# Patient Record
Sex: Female | Born: 1985
Health system: Southern US, Community
[De-identification: ages and names within clinical notes are randomized; demographics above are authoritative.]

## PROBLEM LIST (undated history)

## (undated) DIAGNOSIS — R002 Palpitations: Secondary | ICD-10-CM

## (undated) DIAGNOSIS — G35D Multiple sclerosis, unspecified: Secondary | ICD-10-CM

## (undated) DIAGNOSIS — M419 Scoliosis, unspecified: Secondary | ICD-10-CM

## (undated) DIAGNOSIS — S42302A Unspecified fracture of shaft of humerus, left arm, initial encounter for closed fracture: Secondary | ICD-10-CM

## (undated) DIAGNOSIS — G8929 Other chronic pain: Secondary | ICD-10-CM

## (undated) DIAGNOSIS — M25559 Pain in unspecified hip: Secondary | ICD-10-CM

## (undated) DIAGNOSIS — G35 Multiple sclerosis: Secondary | ICD-10-CM

## (undated) HISTORY — PX: CHOLECYSTECTOMY: SHX55

## (undated) HISTORY — PX: OTHER SURGICAL HISTORY: SHX169

## (undated) HISTORY — PX: NASAL SEPTUM SURGERY: SHX37

## (undated) HISTORY — DX: Multiple sclerosis, unspecified: G35.D

## (undated) HISTORY — DX: Multiple sclerosis: G35

## (undated) HISTORY — DX: Unspecified fracture of shaft of humerus, left arm, initial encounter for closed fracture: S42.302A

## (undated) HISTORY — DX: Palpitations: R00.2

## (undated) HISTORY — PX: TONSILLECTOMY: SUR1361

---

## 2009-09-13 ENCOUNTER — Emergency Department (HOSPITAL_COMMUNITY): Admission: EM | Admit: 2009-09-13 | Discharge: 2009-09-13 | Payer: Self-pay | Admitting: Family Medicine

## 2009-10-22 ENCOUNTER — Ambulatory Visit: Payer: Self-pay | Admitting: Physician Assistant

## 2009-10-22 DIAGNOSIS — K8020 Calculus of gallbladder without cholecystitis without obstruction: Secondary | ICD-10-CM | POA: Insufficient documentation

## 2009-10-22 DIAGNOSIS — G5620 Lesion of ulnar nerve, unspecified upper limb: Secondary | ICD-10-CM | POA: Insufficient documentation

## 2009-10-29 ENCOUNTER — Ambulatory Visit: Payer: Self-pay | Admitting: Physician Assistant

## 2009-10-30 ENCOUNTER — Encounter: Payer: Self-pay | Admitting: Physician Assistant

## 2009-10-30 LAB — CONVERTED CEMR LAB
ALT: 22 units/L (ref 0–35)
AST: 18 units/L (ref 0–37)
Albumin: 3.8 g/dL (ref 3.5–5.2)
Alkaline Phosphatase: 52 units/L (ref 39–117)
BUN: 13 mg/dL (ref 6–23)
Basophils Absolute: 0 10*3/uL (ref 0.0–0.1)
CO2: 21 meq/L (ref 19–32)
Calcium: 8.4 mg/dL (ref 8.4–10.5)
Chloride: 107 meq/L (ref 96–112)
Creatinine, Ser: 0.63 mg/dL (ref 0.40–1.20)
Eosinophils Relative: 2 % (ref 0–5)
Glucose, Bld: 81 mg/dL (ref 70–99)
LDL Cholesterol: 103 mg/dL — ABNORMAL HIGH (ref 0–99)
Lymphocytes Relative: 32 % (ref 12–46)
MCHC: 32.5 g/dL (ref 30.0–36.0)
MCV: 92.3 fL (ref 78.0–100.0)
Monocytes Absolute: 0.4 10*3/uL (ref 0.1–1.0)
Monocytes Relative: 5 % (ref 3–12)
Neutro Abs: 5.5 10*3/uL (ref 1.7–7.7)
RBC: 4.56 M/uL (ref 3.87–5.11)
RDW: 13.9 % (ref 11.5–15.5)
Sodium: 140 meq/L (ref 135–145)
Total Bilirubin: 0.5 mg/dL (ref 0.3–1.2)
VLDL: 26 mg/dL (ref 0–40)
WBC: 9 10*3/uL (ref 4.0–10.5)

## 2010-02-02 ENCOUNTER — Emergency Department (HOSPITAL_COMMUNITY)
Admission: EM | Admit: 2010-02-02 | Discharge: 2010-02-02 | Payer: Self-pay | Source: Home / Self Care | Admitting: Emergency Medicine

## 2010-04-01 NOTE — Letter (Signed)
Summary: PT INFORMATION SHEET  PT INFORMATION SHEET   Imported By: Arta Bruce 10/23/2009 11:49:39  _____________________________________________________________________  External Attachment:    Type:   Image     Comment:   External Document

## 2010-04-01 NOTE — Assessment & Plan Note (Signed)
Summary: NEW ESTAB/ LEFT ARM///KT   Vital Signs:  Patient profile:   25 year old female Height:      68 inches Weight:      357 pounds BMI:     54.48 Temp:     97.8 degrees F oral Pulse rate:   64 / minute Pulse rhythm:   regular Resp:     18 per minute BP sitting:   98 / 68  (left arm)  Vitals Entered By: CMA Student CC: new patient visit, left side for left arm, elbow to fingers experiencing knumbness, 2 weeks onset Is Patient Diabetic? No Pain Assessment Patient in pain? no       Does patient need assistance? Functional Status Self care Ambulation Normal   Primary Care Provider:  Tereso Newcomer, PA-C  CC:  new patient visit, left side for left arm, elbow to fingers experiencing knumbness, and 2 weeks onset.  History of Present Illness: New patient.  Moved here from [facility] in Jan. Health maint: No pap in more than a year.  Never abnormal. Has IUD.  Placed in 2009.  L arm pain:  x 2 weeks.  No injury.  Sits at desk all day.  Uses telephone.  Rests elbow on desk.  Notes worsening pain with leaning on desk and doing her job.  Notes tingling and numbness in 4th and 5th fingers.  No pain.  Comes and goes.  Has not tried anything for it.   Habits & Providers  Alcohol-Tobacco-Diet     Tobacco Status: current  Exercise-Depression-Behavior     Drug Use: no  Current Medications (verified): 1)  None  Allergies (verified): No Known Drug Allergies  Past History:  Past Medical History: Cholelithiasis   a.  apparently had cholestatic jaundice and emergent cholecystectomy in [place] in 12/2008   b.  states she has retained stone s/p IUD 2009  Past Surgical History: Cholecystectomy 12/2008 Tonsillectomy deviated septum surgery  Family History: PGF - DM MGM - HTN MGF - lymphoma  Social History: Current Smoker   a.  3/4 ppd since age 50 Alcohol use-yes (rare) Drug use-no Single . . has boyfriend 1 kid Occupation: Education officer, community Smoking Status:   current Drug Use:  no Occupation:  employed  Review of Systems      See HPI General:  Denies chills, fatigue, and fever. CV:  Denies shortness of breath with exertion. GI:  Denies abdominal pain, bloody stools, and vomiting. GU:  Denies hematuria. Endo:  Complains of excessive thirst; denies weight change. Heme:  Denies bleeding.  Physical Exam  General:  alert, well-developed, and well-nourished.   Head:  normocephalic and atraumatic.   Neck:  supple.   Lungs:  normal breath sounds.   Heart:  normal rate and regular rhythm.   Msk:  no pain with percussion of ulnar tunnel on left grip strength mildly decreased on left no deformity of left hand Neurologic:  biceps and brachiradialis DTRs 2 + bilat  Psych:  normally interactive.     Impression & Recommendations:  Problem # 1:  ULNAR NEUROPATHY (ICD-354.2) nsaids elbow brace check thyroid, sugar and cbc  Problem # 2:  PREVENTIVE HEALTH CARE (ICD-V70.0) schedule CPP wants chol checked . . mother has high trigs  Complete Medication List: 1)  Naprosyn 500 Mg Tabs (Naproxen) .... Take 1 tablet by mouth two times a day with food as needed  Patient Instructions: 1)  Take naprosyn 500 mg by mouth two times a day with food for 5-7  days.  Then, take two times a day as needed. 2)  Get an elbow brace at the pharmacy or at a medical supply store (there is one on [address] near the hospital).  It should have some padding to protect your elbow especially when you lean on your elbow. 3)  Wear it at work unit symptoms resolve, then as needed.  Wear at bedtime for the first 1-2 weeks, then as needed. 4)  Make earlier follow up if symptoms do not resolve. 5)  Return fasting for labs: 6)  CMET, CBC, TSH, Lipids (Dx 354.2; 278.01; V70.0) 7)  Please schedule a follow-up appointment in 3 months with Scott for CPP.  Prescriptions: NAPROSYN 500 MG TABS (NAPROXEN) Take 1 tablet by mouth two times a day with food as needed  #60 x 1   Entered  and Authorized by:   Tereso Newcomer PA-C   Signed by:   Tereso Newcomer PA-C on 10/22/2009   Method used:   Print then Give to Patient   RxID:   [id number]

## 2010-04-01 NOTE — Letter (Signed)
Summary: Lipid Letter  [facility]  [address] [place], [place] [address]   Phone: [phone]  Fax: [phone]    10/30/2009  [facility] [address] [place], [place]  [address]  Dear Lindie Spruce:  We have carefully reviewed your last lipid profile from 10/29/2009 and the results are noted below with a summary of recommendations for lipid management.    Cholesterol:       166     Goal: <200   HDL "good" Cholesterol:   37     Goal: >40   LDL "bad" Cholesterol:   103     Goal: <130   Triglycerides:       129     Goal: <150    Your blood counts, liver and kidney function and sugar were all normal.    TLC Diet (Therapeutic Lifestyle Change): Saturated Fats & Transfatty acids should be kept < 7% of total calories ***Reduce Saturated Fats Polyunstaurated Fat can be up to 10% of total calories Monounsaturated Fat Fat can be up to 20% of total calories Total Fat should be no greater than 25-35% of total calories Carbohydrates should be 50-60% of total calories Protein should be approximately 15% of total calories Fiber should be at least 20-30 grams a day ***Increased fiber may help lower LDL Total Cholesterol should be < 200mg /day Consider adding plant stanol/sterols to diet (example: Benacol spread) ***A higher intake of unsaturated fat may reduce Triglycerides and Increase HDL    Adjunctive Measures (may lower LIPIDS and reduce risk of Heart Attack) include: Aerobic Exercise (20-30 minutes 3-4 times a week) Limit Alcohol Consumption Weight Reduction Dietary Fiber 20-30 grams a day by mouth    Current Medications: 1)    Naprosyn 500 Mg Tabs (Naproxen) .... Take 1 tablet by mouth two times a day with food as needed  If you have any questions, please call.   Sincerely,    Tereso Newcomer, PA-C

## 2010-05-13 LAB — URINALYSIS, ROUTINE W REFLEX MICROSCOPIC
Bilirubin Urine: NEGATIVE
Protein, ur: 30 mg/dL — AB
Specific Gravity, Urine: 1.041 — ABNORMAL HIGH (ref 1.005–1.030)

## 2010-05-13 LAB — POCT I-STAT, CHEM 8
Calcium, Ion: 1.08 mmol/L — ABNORMAL LOW (ref 1.12–1.32)
Chloride: 106 mEq/L (ref 96–112)
Creatinine, Ser: 0.9 mg/dL (ref 0.4–1.2)
HCT: 49 % — ABNORMAL HIGH (ref 36.0–46.0)
Hemoglobin: 16.7 g/dL — ABNORMAL HIGH (ref 12.0–15.0)
Potassium: 3.6 mEq/L (ref 3.5–5.1)
Sodium: 140 mEq/L (ref 135–145)
TCO2: 27 mmol/L (ref 0–100)

## 2010-05-13 LAB — URINE CULTURE: Culture  Setup Time: 201112042345

## 2010-05-13 LAB — CBC
HCT: 45.8 % (ref 36.0–46.0)
Hemoglobin: 15.8 g/dL — ABNORMAL HIGH (ref 12.0–15.0)
MCH: 30.7 pg (ref 26.0–34.0)
MCHC: 34.5 g/dL (ref 30.0–36.0)
Platelets: 210 10*3/uL (ref 150–400)
WBC: 10.6 10*3/uL — ABNORMAL HIGH (ref 4.0–10.5)

## 2010-05-13 LAB — DIFFERENTIAL
Basophils Absolute: 0 10*3/uL (ref 0.0–0.1)
Eosinophils Relative: 2 % (ref 0–5)
Lymphocytes Relative: 10 % — ABNORMAL LOW (ref 12–46)
Monocytes Absolute: 0.5 10*3/uL (ref 0.1–1.0)
Monocytes Relative: 4 % (ref 3–12)
Neutrophils Relative %: 84 % — ABNORMAL HIGH (ref 43–77)

## 2010-08-31 ENCOUNTER — Emergency Department (HOSPITAL_COMMUNITY)
Admission: EM | Admit: 2010-08-31 | Discharge: 2010-08-31 | Disposition: A | Discharge status: Home / Self Care | Payer: Self-pay | Attending: Emergency Medicine | Admitting: Emergency Medicine

## 2010-08-31 DIAGNOSIS — N390 Urinary tract infection, site not specified: Secondary | ICD-10-CM | POA: Insufficient documentation

## 2010-08-31 LAB — URINALYSIS, ROUTINE W REFLEX MICROSCOPIC
Glucose, UA: NEGATIVE mg/dL
Ketones, ur: NEGATIVE mg/dL
Protein, ur: 100 mg/dL — AB
Urobilinogen, UA: 0.2 mg/dL (ref 0.0–1.0)

## 2010-08-31 LAB — URINE MICROSCOPIC-ADD ON

## 2010-09-03 LAB — URINE CULTURE

## 2010-09-30 ENCOUNTER — Emergency Department (HOSPITAL_COMMUNITY)
Admission: EM | Admit: 2010-09-30 | Discharge: 2010-09-30 | Disposition: A | Discharge status: Home / Self Care | Payer: Self-pay | Attending: Emergency Medicine | Admitting: Emergency Medicine

## 2010-09-30 ENCOUNTER — Emergency Department (HOSPITAL_COMMUNITY): Payer: Self-pay

## 2010-09-30 ENCOUNTER — Encounter: Payer: Self-pay | Admitting: Emergency Medicine

## 2010-09-30 DIAGNOSIS — S61219A Laceration without foreign body of unspecified finger without damage to nail, initial encounter: Secondary | ICD-10-CM

## 2010-09-30 DIAGNOSIS — S61209A Unspecified open wound of unspecified finger without damage to nail, initial encounter: Secondary | ICD-10-CM | POA: Insufficient documentation

## 2010-09-30 DIAGNOSIS — W268XXA Contact with other sharp object(s), not elsewhere classified, initial encounter: Secondary | ICD-10-CM | POA: Insufficient documentation

## 2010-09-30 MED ORDER — TETANUS-DIPHTH-ACELL PERTUSSIS 5-2.5-18.5 LF-MCG/0.5 IM SUSP
0.5000 mL | Freq: Once | INTRAMUSCULAR | Status: AC
  Administered 2010-09-30: 0.5 mL via INTRAMUSCULAR

## 2010-09-30 MED ORDER — HYDROCODONE-ACETAMINOPHEN 5-325 MG PO TABS: 1.0000 | ORAL_TABLET | ORAL | Status: AC | PRN

## 2010-09-30 MED ORDER — LIDOCAINE HCL 2 % IJ SOLN
10.0000 mL | Freq: Once | INTRAMUSCULAR | Status: AC
  Administered 2010-09-30: 200 mg

## 2010-09-30 MED ORDER — LIDOCAINE HCL 2 % IJ SOLN
10.0000 mL | Freq: Once | INTRAMUSCULAR | Status: AC
  Administered 2010-09-30: 200 mg via INTRADERMAL

## 2010-09-30 NOTE — ED Notes (Signed)
Suture tray and lidocaine at bedside 
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
## 2010-09-30 NOTE — ED Notes (Signed)
Tube gauze dressings applied to 2nd and 3rd digits left hand after lacerations sutured.
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
## 2010-10-10 ENCOUNTER — Emergency Department (HOSPITAL_COMMUNITY)
Admission: EM | Admit: 2010-10-10 | Discharge: 2010-10-10 | Disposition: A | Discharge status: Home / Self Care | Payer: Self-pay | Attending: Emergency Medicine | Admitting: Emergency Medicine

## 2010-10-10 ENCOUNTER — Encounter (HOSPITAL_COMMUNITY): Payer: Self-pay | Admitting: *Deleted

## 2010-10-10 DIAGNOSIS — Z4802 Encounter for removal of sutures: Secondary | ICD-10-CM | POA: Insufficient documentation

## 2010-10-10 DIAGNOSIS — F17200 Nicotine dependence, unspecified, uncomplicated: Secondary | ICD-10-CM | POA: Insufficient documentation

## 2010-10-10 MED ORDER — BACITRACIN ZINC 500 UNIT/GM EX OINT
TOPICAL_OINTMENT | CUTANEOUS | Status: AC
  Administered 2010-10-10: 13:00:00
  Filled 2010-10-10: qty 0.9

## 2010-10-10 NOTE — ED Notes (Signed)
Suture removal from left index and middle fingers.  States wants to have checked for infection as well.  States Middle finger still hurts and unable to bend.

## 2010-10-10 NOTE — ED Provider Notes (Signed)
History     CSN: [id number] Arrival date & time: 10/10/2010 11:00 AM  Chief Complaint  Patient presents with  . Suture / Staple Removal   Patient is a 25 y.o. female presenting with suture removal. The history is provided by the patient.  Suture / Staple Removal  The sutures were placed 3 to 6 days ago. Treatments since wound repair include regular soap and water washings. There has been no drainage from the wound. There is new redness present. There is no swelling present. The pain has not changed. There is difficulty moving the extremity or digit due to pain.    History reviewed. No pertinent past medical history.  Past Surgical History  Procedure Date  . Cholecystectomy   . Tonsillectomy     History reviewed. No pertinent family history.  History  Substance Use Topics  . Smoking status: Current Everyday Smoker -- 0.5 packs/day    Types: Cigarettes  . Smokeless tobacco: Not on file  . Alcohol Use: No    OB History    Grav Para Term Preterm Abortions TAB SAB Ect Mult Living                  Review of Systems  Musculoskeletal: Positive for arthralgias. Negative for myalgias and joint swelling.  Skin: Positive for wound.  All other systems reviewed and are negative.    Physical Exam  BP 126/50  Pulse 74  Temp(Src) 98.1 F (36.7 C) (Oral)  Resp 20  Ht 5\' 9"  (1.753 m)  Wt 344 lb (156.037 kg)  BMI 50.80 kg/m2  SpO2 100%  LMP 09/29/2010  Physical Exam  Nursing note and vitals reviewed. Constitutional: She is oriented to person, place, and time.  Musculoskeletal: She exhibits edema and tenderness.  Neurological: She is alert and oriented to person, place, and time. She exhibits normal muscle tone. Coordination normal.  Skin: Skin is warm and dry.       Previously placed sutures to the left index and third fingers.  Suture lines intact.  Mild erythema and slight edema of the distal portion of the third finger.  Ttp over the suture line.  No drainage      ED  Course  Procedures  MDM  1210  Laceration to the left second and third fingers.  Sutures are intact. Mild erythema and STS of the distal third finger surrounding the sutures.  No drainage.  Pt has decreased sensation to the lateral aspect of the third finger and unable to move her finger at the DIP joint.  CR<2 sec.  Pin point sensation to the tuft of the finger intact.  Sutures were removed by the nursing staff w/o difficulty.  Suture line remains intact.  I have advised her to f/u with ortho next week for recheck and possiblity of nerve and/or tendon injury.  I will also begin abx therapy and she agrees to care plan and also agrees to close f/u with ortho.      Charlita Brian L. [place], [place] 10/17/10 1759

## 2010-10-10 NOTE — ED Notes (Signed)
Sutures removed. Pt tolerated well. Wounds closed. No drainage present. Gauze dressing applied.

## 2010-11-17 NOTE — ED Provider Notes (Signed)
Evaluation and management procedures were performed by the PA/NP under my supervision/collaboration.    Felisa Bonier, MD 11/17/10 [phone]

## 2012-03-26 ENCOUNTER — Emergency Department (HOSPITAL_COMMUNITY)
Admission: EM | Admit: 2012-03-26 | Discharge: 2012-03-26 | Disposition: A | Discharge status: Home / Self Care | Payer: Self-pay | Attending: Emergency Medicine | Admitting: Emergency Medicine

## 2012-03-26 ENCOUNTER — Encounter (HOSPITAL_COMMUNITY): Payer: Self-pay | Admitting: *Deleted

## 2012-03-26 ENCOUNTER — Emergency Department (HOSPITAL_COMMUNITY): Payer: Self-pay

## 2012-03-26 DIAGNOSIS — Z975 Presence of (intrauterine) contraceptive device: Secondary | ICD-10-CM | POA: Insufficient documentation

## 2012-03-26 DIAGNOSIS — F17200 Nicotine dependence, unspecified, uncomplicated: Secondary | ICD-10-CM | POA: Insufficient documentation

## 2012-03-26 DIAGNOSIS — M25559 Pain in unspecified hip: Secondary | ICD-10-CM | POA: Insufficient documentation

## 2012-03-26 MED ORDER — IBUPROFEN 800 MG PO TABS
800.0000 mg | ORAL_TABLET | Freq: Once | ORAL | Status: AC
  Administered 2012-03-26: 800 mg via ORAL

## 2012-03-26 MED ORDER — MELOXICAM 7.5 MG PO TABS: ORAL_TABLET | ORAL | Status: DC

## 2012-03-26 MED ORDER — IBUPROFEN 800 MG PO TABS
ORAL_TABLET | ORAL | Status: AC
  Administered 2012-03-26: 800 mg via ORAL
  Filled 2012-03-26: qty 1

## 2012-03-26 MED ORDER — HYDROCODONE-ACETAMINOPHEN 5-325 MG PO TABS: ORAL_TABLET | ORAL | Status: DC

## 2012-03-26 NOTE — ED Notes (Signed)
Pt states awoke this morning with increasing left hip pain.

## 2012-03-26 NOTE — ED Provider Notes (Signed)
Medical screening examination/treatment/procedure(s) were performed by non-physician practitioner and as supervising physician I was immediately available for consultation/collaboration.  Rasheda Ledger, MD 03/26/12 1713 

## 2012-03-26 NOTE — ED Notes (Signed)
Pt presents with left hip pain that has worsened over the past 2 days. Pt denies fall/injury and chronic pain. Reports increased pain when ambulating. No deformity noted. NAD noted

## 2012-03-26 NOTE — ED Provider Notes (Signed)
History     CSN: [id number]  Arrival date & time 03/26/12  1105   First MD Initiated Contact with Patient 03/26/12 1127      Chief Complaint  Patient presents with  . Hip Pain    (Consider location/radiation/quality/duration/timing/severity/associated sxs/prior treatment) Patient is a 27 y.o. female presenting with hip pain. The history is provided by the patient.  Hip Pain This is a new problem. The current episode started yesterday. The problem occurs constantly. The problem has been gradually worsening. Pertinent negatives include no abdominal pain, arthralgias, chest pain, chills, coughing, fever, joint swelling or neck pain. The symptoms are aggravated by standing and walking. She has tried nothing for the symptoms. The treatment provided no relief.    History reviewed. No pertinent past medical history.  Past Surgical History  Procedure Date  . Cholecystectomy   . Tonsillectomy     History reviewed. No pertinent family history.  History  Substance Use Topics  . Smoking status: Current Every Day Smoker -- 0.5 packs/day    Types: Cigarettes  . Smokeless tobacco: Not on file  . Alcohol Use: No    OB History    Grav Para Term Preterm Abortions TAB SAB Ect Mult Living                  Review of Systems  Constitutional: Negative for fever, chills and activity change.       All ROS Neg except as noted in HPI  HENT: Negative for nosebleeds and neck pain.   Eyes: Negative for photophobia and discharge.  Respiratory: Negative for cough, shortness of breath and wheezing.   Cardiovascular: Negative for chest pain and palpitations.  Gastrointestinal: Negative for abdominal pain and blood in stool.  Genitourinary: Negative for dysuria, frequency and hematuria.  Musculoskeletal: Negative for back pain, joint swelling and arthralgias.  Skin: Negative.   Neurological: Negative for dizziness, seizures and speech difficulty.  Psychiatric/Behavioral: Negative for  hallucinations and confusion.    Allergies  Review of patient's allergies indicates no known allergies.  Home Medications   Current Outpatient Rx  Name  Route  Sig  Dispense  Refill  . IBUPROFEN 200 MG PO TABS   Oral   Take 200 mg by mouth every 6 (six) hours as needed. Pain          . LEVONORGESTREL 20 MCG/24HR IU IUD   Intrauterine   1 each by Intrauterine route once.           Marland Kitchen PRENATA PO   Oral   Take 1 tablet by mouth daily.             BP 109/75  Pulse 98  Temp 97.9 F (36.6 C) (Oral)  Resp 20  Ht 5\' 9"  (1.753 m)  Wt 320 lb (145.151 kg)  BMI 47.26 kg/m2  SpO2 99%  LMP 03/20/2012  Physical Exam  Nursing note and vitals reviewed. Constitutional: She is oriented to person, place, and time. She appears well-developed and well-nourished.  Non-toxic appearance.  HENT:  Head: Normocephalic.  Right Ear: Tympanic membrane and external ear normal.  Left Ear: Tympanic membrane and external ear normal.  Eyes: EOM and lids are normal. Pupils are equal, round, and reactive to light.  Neck: Normal range of motion. Neck supple. Carotid bruit is not present.  Cardiovascular: Normal rate, regular rhythm, normal heart sounds, intact distal pulses and normal pulses.   Pulmonary/Chest: Breath sounds normal. No respiratory distress.  Abdominal: Soft. Bowel sounds are normal. There  is no tenderness. There is no guarding.  Musculoskeletal: Normal range of motion.  Lymphadenopathy:       Head (right side): No submandibular adenopathy present.       Head (left side): No submandibular adenopathy present.    She has no cervical adenopathy.  Neurological: She is alert and oriented to person, place, and time. She has normal strength. No cranial nerve deficit or sensory deficit.  Skin: Skin is warm and dry.  Psychiatric: She has a normal mood and affect. Her speech is normal.    ED Course  Procedures (including critical care time)  Labs Reviewed - No data to display No  results found. Pulse oximetry 99% on room air. Within normal limits by my interpretation.  No diagnosis found.    MDM  I have reviewed nursing notes, vital signs, and all appropriate lab and imaging results for this patient.  X-ray of the left hip and pelvis reveal mild acetabular degenerative changes. No fracture or dislocation. The patient has been given the result of the examination is well as the x-ray results. The patient is advised to see orthopedics for additional evaluation and management of her hip pain. Prescription for Mobic 7.5 mg, and Norco 5 mg on one or 2 tablets every 4 hours #20, given to the patient.      Kathie Dike, [place] 03/26/12 1350

## 2012-12-09 ENCOUNTER — Emergency Department (HOSPITAL_COMMUNITY)
Admission: EM | Admit: 2012-12-09 | Discharge: 2012-12-09 | Disposition: A | Discharge status: Home / Self Care | Payer: Self-pay | Attending: Emergency Medicine | Admitting: Emergency Medicine

## 2012-12-09 ENCOUNTER — Emergency Department (HOSPITAL_COMMUNITY): Payer: Self-pay

## 2012-12-09 ENCOUNTER — Encounter (HOSPITAL_COMMUNITY): Payer: Self-pay | Admitting: Emergency Medicine

## 2012-12-09 DIAGNOSIS — J029 Acute pharyngitis, unspecified: Secondary | ICD-10-CM | POA: Insufficient documentation

## 2012-12-09 DIAGNOSIS — F17200 Nicotine dependence, unspecified, uncomplicated: Secondary | ICD-10-CM | POA: Insufficient documentation

## 2012-12-09 DIAGNOSIS — J40 Bronchitis, not specified as acute or chronic: Secondary | ICD-10-CM | POA: Insufficient documentation

## 2012-12-09 DIAGNOSIS — B9789 Other viral agents as the cause of diseases classified elsewhere: Secondary | ICD-10-CM | POA: Insufficient documentation

## 2012-12-09 DIAGNOSIS — Z79899 Other long term (current) drug therapy: Secondary | ICD-10-CM | POA: Insufficient documentation

## 2012-12-09 DIAGNOSIS — R109 Unspecified abdominal pain: Secondary | ICD-10-CM | POA: Insufficient documentation

## 2012-12-09 DIAGNOSIS — B349 Viral infection, unspecified: Secondary | ICD-10-CM

## 2012-12-09 MED ORDER — GUAIFENESIN ER 600 MG PO TB12: 1200.0000 mg | ORAL_TABLET | Freq: Two times a day (BID) | ORAL | Status: DC

## 2012-12-09 MED ORDER — ALBUTEROL SULFATE HFA 108 (90 BASE) MCG/ACT IN AERS
2.0000 | INHALATION_SPRAY | Freq: Once | RESPIRATORY_TRACT | Status: AC
  Administered 2012-12-09: 2 via RESPIRATORY_TRACT
  Filled 2012-12-09: qty 6.7

## 2012-12-09 MED ORDER — ALBUTEROL SULFATE (5 MG/ML) 0.5% IN NEBU
5.0000 mg | INHALATION_SOLUTION | Freq: Once | RESPIRATORY_TRACT | Status: AC
  Administered 2012-12-09: 5 mg via RESPIRATORY_TRACT
  Filled 2012-12-09: qty 1

## 2012-12-09 MED ORDER — ALBUTEROL SULFATE HFA 108 (90 BASE) MCG/ACT IN AERS: 2.0000 | INHALATION_SPRAY | RESPIRATORY_TRACT | Status: DC | PRN

## 2012-12-09 MED ORDER — DEXAMETHASONE SODIUM PHOSPHATE 10 MG/ML IJ SOLN
10.0000 mg | Freq: Once | INTRAMUSCULAR | Status: AC
  Administered 2012-12-09: 10 mg via INTRAMUSCULAR
  Filled 2012-12-09: qty 1

## 2012-12-09 MED ORDER — PREDNISONE 10 MG PO TABS: 20.0000 mg | ORAL_TABLET | Freq: Every day | ORAL | Status: DC

## 2012-12-09 MED ORDER — ACETAMINOPHEN 500 MG PO TABS
1000.0000 mg | ORAL_TABLET | Freq: Once | ORAL | Status: AC
  Administered 2012-12-09: 1000 mg via ORAL
  Filled 2012-12-09: qty 2

## 2012-12-09 MED ORDER — AZITHROMYCIN 250 MG PO TABS: ORAL_TABLET | ORAL | Status: DC

## 2012-12-09 NOTE — ED Provider Notes (Signed)
Megan Presti S 8:00 PM patient discussed and signed out. Patient with general body aches, coughing, sneezing and runny nose. Symptoms typical for viral URI with wheezing on exam concerning for bronchitis. Patient also with fever chest x-ray pending to rule out pneumonia.  Chest x-ray reviewed without signs of pneumonia. There are chronic bronchitis changes. Patient has every day smoker. At this time we'll plan treatments for bronchitis. Patient is otherwise well appearing and stable for discharge.  Angus Seller, PA-C 12/09/12 2045

## 2012-12-09 NOTE — ED Notes (Signed)
Generalized body aches. Chest burning coughing sneezing runny nose chest tightness

## 2012-12-09 NOTE — ED Provider Notes (Signed)
Medical screening examination/treatment/procedure(s) were performed by non-physician practitioner and as supervising physician I was immediately available for consultation/collaboration.  Juliet Rude. Rubin Payor, MD 12/09/12 2235

## 2012-12-09 NOTE — ED Provider Notes (Signed)
CSN: [id number]     Arrival date & time 12/09/12  1741 History  This chart was scribed for Cleda Mccreedy, PA-C working with Juliet Rude. Rubin Payor, MD by Caryn Bee, ED Scribe. This patient was seen in room WTR6/WTR6 and the patient's care was started at 6:11PM.     No chief complaint on file.   HPI HPI Comments: Jessica [name] is a 27 y.o. female who presents to the Emergency Department complaining of symptoms of URI. Pt awoke this morning with burning chest pain with cough. Throughout the day symptoms began to increase and included chills, body aches, malaise, and fatigue. She has a distant h/o asthma and is a daily smoker. Pt states that everyone in her family has the same symptoms. She denies fever, nausea, vomiting. She does endorse some mild abdominal pain which is non focal.    No past medical history on file. Past Surgical History  Procedure Laterality Date  . Cholecystectomy    . Tonsillectomy     No family history on file. History  Substance Use Topics  . Smoking status: Current Every Day Smoker -- 0.50 packs/day    Types: Cigarettes  . Smokeless tobacco: Not on file  . Alcohol Use: No   OB History   Grav Para Term Preterm Abortions TAB SAB Ect Mult Living                 Review of Systems  Constitutional: Positive for chills and fatigue. Negative for fever.  HENT: Positive for rhinorrhea and sore throat.   Respiratory: Positive for cough, shortness of breath and wheezing.   Cardiovascular: Negative for chest pain.  Gastrointestinal: Positive for abdominal pain. Negative for nausea, vomiting, diarrhea and constipation.  Musculoskeletal: Positive for myalgias.  Skin: Negative for rash.    Allergies  Review of patient's allergies indicates no known allergies.  Home Medications   Current Outpatient Rx  Name  Route  Sig  Dispense  Refill  . HYDROcodone-acetaminophen (NORCO/VICODIN) 5-325 MG per tablet   Oral   Take 1 tablet by mouth every 4 (four) hours as  needed for pain. 1 or 2 po q4h prn pain         . ibuprofen (ADVIL,MOTRIN) 200 MG tablet   Oral   Take 200 mg by mouth every 6 (six) hours as needed for pain or headache. Pain         . meloxicam (MOBIC) 7.5 MG tablet      1 po bid with food   12 tablet   0   . OVER THE COUNTER MEDICATION   Oral   Take 1 tablet by mouth daily as needed (asllergies).         . Pseudoeph-Doxylamine-DM-APAP (NYQUIL PO)   Oral   Take 15 mLs by mouth daily as needed (cold).         Marland Kitchen levonorgestrel (MIRENA) 20 MCG/24HR IUD   Intrauterine   1 each by Intrauterine route once.            Triage Vitals: BP 141/76  Pulse 96  Temp(Src) 99.7 F (37.6 C) (Oral)  Resp 17  SpO2 93%  Physical Exam  Nursing note and vitals reviewed. Constitutional: She is oriented to person, place, and time. She appears well-developed and well-nourished. No distress.  HENT:  Head: Normocephalic and atraumatic.  Eyes: EOM are normal.  Glassy eye appearance  Neck: Neck supple. No tracheal deviation present.  Cardiovascular: Normal rate.   Pulmonary/Chest: Effort normal. No respiratory distress. She  has wheezes.  Abdominal: There is no tenderness.  Musculoskeletal: Normal range of motion.  Lymphadenopathy:    She has no cervical adenopathy.  Neurological: She is alert and oriented to person, place, and time.  Skin: Skin is warm and dry.  Psychiatric: She has a normal mood and affect. Her behavior is normal.    ED Course DIAGNOSTIC STUDIES: Oxygen Saturation is 93% on room air, normal by my interpretation.    COORDINATION OF CARE:  Procedures (including critical care time) 6:16 PM-Discussed treatment plan which includes albuterol nebulizer treatment with pt at bedside and pt agreed to plan.  7:29 PM- Recheck: Pt's wheezing has improved but is still present.  Labs Review Labs Reviewed - No data to display Imaging Review No results found.  EKG Interpretation   None       MDM   1. Bronchitis    2. Viral infection    Pt here with symptoms of "URI for one day. PE reveals diffuse expiratory wheezes. Pt receiving albuterol nebulizer treatment. Will re-evaluate.   7:39 PM  Filed Vitals:   12/09/12 1756 12/09/12 1940 12/09/12 2053  BP: 141/76 143/68 114/79  Pulse: 96 98 92  Temp: 99.7 F (37.6 C) 100.9 F (38.3 C) 98.2 F (36.8 C)  TempSrc: Oral Oral Oral  Resp: 17 20 18   SpO2: 93% 96% 98%    patient breathing improved however still having diffuse wheezes. She will get decadron treatment. Patient aslo develop e fever 100.9, giving tylenol. Sill get CXR. I have given report to PA Dammen who will disposition the patient after cxr.    Arthor Captain, PA-C 12/10/12 [phone]

## 2012-12-11 NOTE — ED Provider Notes (Signed)
Medical screening examination/treatment/procedure(s) were performed by non-physician practitioner and as supervising physician I was immediately available for consultation/collaboration.  Arieanna Pressey R. Delton Stelle, MD 12/11/12 2336 

## 2013-05-09 ENCOUNTER — Emergency Department (HOSPITAL_COMMUNITY): Payer: Self-pay

## 2013-05-09 ENCOUNTER — Observation Stay (HOSPITAL_COMMUNITY): Payer: Self-pay

## 2013-05-09 ENCOUNTER — Encounter (HOSPITAL_COMMUNITY): Payer: Self-pay | Admitting: Emergency Medicine

## 2013-05-09 ENCOUNTER — Inpatient Hospital Stay (HOSPITAL_COMMUNITY)
Admission: EM | Admit: 2013-05-09 | Discharge: 2013-05-09 | DRG: 948 | Discharge status: Against Medical Advice | Payer: Self-pay | Attending: Internal Medicine | Admitting: Internal Medicine

## 2013-05-09 DIAGNOSIS — R29898 Other symptoms and signs involving the musculoskeletal system: Secondary | ICD-10-CM | POA: Diagnosis present

## 2013-05-09 DIAGNOSIS — R2990 Unspecified symptoms and signs involving the nervous system: Secondary | ICD-10-CM | POA: Diagnosis present

## 2013-05-09 DIAGNOSIS — R5381 Other malaise: Principal | ICD-10-CM | POA: Diagnosis present

## 2013-05-09 DIAGNOSIS — Z79899 Other long term (current) drug therapy: Secondary | ICD-10-CM

## 2013-05-09 DIAGNOSIS — F17200 Nicotine dependence, unspecified, uncomplicated: Secondary | ICD-10-CM | POA: Diagnosis present

## 2013-05-09 DIAGNOSIS — R5383 Other fatigue: Principal | ICD-10-CM

## 2013-05-09 HISTORY — DX: Other chronic pain: G89.29

## 2013-05-09 HISTORY — DX: Pain in unspecified hip: M25.559

## 2013-05-09 HISTORY — DX: Scoliosis, unspecified: M41.9

## 2013-05-09 LAB — BASIC METABOLIC PANEL
BUN: 15 mg/dL (ref 6–23)
CO2: 26 mEq/L (ref 19–32)
CREATININE: 1.14 mg/dL — AB (ref 0.50–1.10)
Calcium: 9 mg/dL (ref 8.4–10.5)
Chloride: 104 mEq/L (ref 96–112)
GFR calc Af Amer: 76 mL/min — ABNORMAL LOW (ref >90)
GFR, EST NON AFRICAN AMERICAN: 65 mL/min — AB (ref >90)
GLUCOSE: 86 mg/dL (ref 70–99)
POTASSIUM: 3.7 meq/L (ref 3.7–5.3)
Sodium: 141 mEq/L (ref 137–147)

## 2013-05-09 LAB — CBC WITH DIFFERENTIAL/PLATELET
Basophils Absolute: 0 10*3/uL (ref 0.0–0.1)
Basophils Relative: 0 % (ref 0–1)
EOS ABS: 0.2 10*3/uL (ref 0.0–0.7)
EOS PCT: 2 % (ref 0–5)
HEMATOCRIT: 37.9 % (ref 36.0–46.0)
HEMOGLOBIN: 12.8 g/dL (ref 12.0–15.0)
LYMPHS ABS: 2.8 10*3/uL (ref 0.7–4.0)
Lymphocytes Relative: 30 % (ref 12–46)
MCH: 30.4 pg (ref 26.0–34.0)
MCHC: 33.8 g/dL (ref 30.0–36.0)
MCV: 90 fL (ref 78.0–100.0)
MONO ABS: 0.5 10*3/uL (ref 0.1–1.0)
MONOS PCT: 6 % (ref 3–12)
NEUTROS PCT: 63 % (ref 43–77)
Neutro Abs: 6 10*3/uL (ref 1.7–7.7)
Platelets: 206 10*3/uL (ref 150–400)
RBC: 4.21 MIL/uL (ref 3.87–5.11)
RDW: 13.1 % (ref 11.5–15.5)
WBC: 9.5 10*3/uL (ref 4.0–10.5)

## 2013-05-09 LAB — CK: Total CK: 103 U/L (ref 7–177)

## 2013-05-09 LAB — SEDIMENTATION RATE: Sed Rate: 12 mm/hr (ref 0–22)

## 2013-05-09 MED ORDER — HEPARIN SODIUM (PORCINE) 5000 UNIT/ML IJ SOLN: 5000.0000 [IU] | Freq: Three times a day (TID) | INTRAMUSCULAR | Status: DC

## 2013-05-09 MED ORDER — ONDANSETRON HCL 4 MG/2ML IJ SOLN: 4.0000 mg | Freq: Four times a day (QID) | INTRAMUSCULAR | Status: DC | PRN

## 2013-05-09 MED ORDER — IBUPROFEN 400 MG PO TABS: 600.0000 mg | ORAL_TABLET | Freq: Four times a day (QID) | ORAL | Status: DC | PRN

## 2013-05-09 MED ORDER — ONDANSETRON HCL 4 MG PO TABS: 4.0000 mg | ORAL_TABLET | Freq: Four times a day (QID) | ORAL | Status: DC | PRN

## 2013-05-09 NOTE — ED Notes (Signed)
Patient came to desk and told Dr. Blinda Leatherwood that she was going home because she is not prepared to be admitted.  Patient ambulatory with steady gait.  Dr. Blinda Leatherwood informed patient that she could be paralyzed by tomorrow and patient stated that was a chance she would take because she needed to go home.  Patient walked out without signing AMA form after talking with Dr. Blinda Leatherwood.

## 2013-05-09 NOTE — ED Provider Notes (Addendum)
CSN: [id number]     Arrival date & time 05/09/13  1549 History   First MD Initiated Contact with Patient 05/09/13 1739     Chief Complaint  Patient presents with  . Weakness     (Consider location/radiation/quality/duration/timing/severity/associated sxs/prior Treatment) HPI Comments: Patient presents to the ER for evaluation of numbness, tingling with weakness of bilateral lower extremities which began yesterday. Patient reports that both legs are involved yesterday, but today she has noticed advancement of his symptoms up to her torso region. She has not had any arm involvement. Patient denies headache.  Patient is a 28 y.o. female presenting with weakness.  Weakness    Past Medical History  Diagnosis Date  . Chronic hip pain    Past Surgical History  Procedure Laterality Date  . Cholecystectomy    . Tonsillectomy     No family history on file. History  Substance Use Topics  . Smoking status: Current Every Day Smoker -- 0.50 packs/day    Types: Cigarettes  . Smokeless tobacco: Not on file  . Alcohol Use: No   OB History   Grav Para Term Preterm Abortions TAB SAB Ect Mult Living                 Review of Systems  Constitutional: Negative for fever.  Musculoskeletal: Negative for back pain.  Neurological: Positive for weakness and numbness.  All other systems reviewed and are negative.      Allergies  Review of patient's allergies indicates no known allergies.  Home Medications   Current Outpatient Rx  Name  Route  Sig  Dispense  Refill  . azithromycin (ZITHROMAX Z-PAK) 250 MG tablet      Take 2 tablets on day one. Take one tablet on days 2 through 5.   6 tablet   0   . guaiFENesin (MUCINEX) 600 MG 12 hr tablet   Oral   Take 2 tablets (1,200 mg total) by mouth 2 (two) times daily.   60 tablet   0   . HYDROcodone-acetaminophen (NORCO/VICODIN) 5-325 MG per tablet   Oral   Take 1 tablet by mouth every 4 (four) hours as needed for pain. 1 or 2 po q4h  prn pain         . ibuprofen (ADVIL,MOTRIN) 200 MG tablet   Oral   Take 200 mg by mouth every 6 (six) hours as needed for pain or headache. Pain         . levonorgestrel (MIRENA) 20 MCG/24HR IUD   Intrauterine   1 each by Intrauterine route once.           . meloxicam (MOBIC) 7.5 MG tablet      1 po bid with food   12 tablet   0   . OVER THE COUNTER MEDICATION   Oral   Take 1 tablet by mouth daily as needed (asllergies).         . predniSONE (DELTASONE) 10 MG tablet   Oral   Take 2 tablets (20 mg total) by mouth daily.   15 tablet   0   . Pseudoeph-Doxylamine-DM-APAP (NYQUIL PO)   Oral   Take 15 mLs by mouth daily as needed (cold).          BP 123/64  Pulse 78  Temp(Src) 98.3 F (36.8 C) (Oral)  Resp 20  Ht 5\' 9"  (1.753 m)  Wt 312 lb 3 oz (141.607 kg)  BMI 46.08 kg/m2  SpO2 95% Physical Exam  Constitutional: She  is oriented to person, place, and time. She appears well-developed and well-nourished. No distress.  HENT:  Head: Normocephalic and atraumatic.  Right Ear: Hearing normal.  Left Ear: Hearing normal.  Nose: Nose normal.  Mouth/Throat: Oropharynx is clear and moist and mucous membranes are normal.  Eyes: Conjunctivae and EOM are normal. Pupils are equal, round, and reactive to light.  Neck: Normal range of motion. Neck supple.  Cardiovascular: Regular rhythm, S1 normal and S2 normal.  Exam reveals no gallop and no friction rub.   No murmur heard. Pulmonary/Chest: Effort normal and breath sounds normal. No respiratory distress. She exhibits no tenderness.  Abdominal: Soft. Normal appearance and bowel sounds are normal. There is no hepatosplenomegaly. There is no tenderness. There is no rebound, no guarding, no tenderness at McBurney's point and negative Murphy's sign. No hernia.  Musculoskeletal: Normal range of motion.  Neurological: She is alert and oriented to person, place, and time. She has normal strength. No cranial nerve deficit or sensory  deficit. Coordination normal. GCS eye subscore is 4. GCS verbal subscore is 5. GCS motor subscore is 6.  Reflex Scores:      Patellar reflexes are 3+ on the right side and 3+ on the left side. str 4/5 in bilat lower extremities, symmetric (can lift off bed against gravity, but not against resistance)  Skin: Skin is warm, dry and intact. No rash noted. No cyanosis.  Psychiatric: She has a normal mood and affect. Her speech is normal and behavior is normal. Thought content normal.    ED Course  Procedures (including critical care time) Labs Review Labs Reviewed  CBC WITH DIFFERENTIAL  BASIC METABOLIC PANEL  CK  SEDIMENTATION RATE   Imaging Review No results found.   EKG Interpretation None      MDM   Final diagnoses:  None   Patient presents to the ER for evaluation of numbness and weakness of the lower extremities which has been progressing proximally. She reports decreased sensation to her mid torso region. She did have mildly decreased strength in the lower extremities which was symmetric. She is able to lift her legs against gravity, but has difficulty lifting them against some resistance.  Based on these symptoms starting in the legs and then progressing proximally, Guillain-Barr syndrome was considered. Examination, however, revealed brisk reflexes at bilateral patella making this unlikely. Symptoms are symmetric, I do not suspect CVA.  I believe that lumbar spine lesion such as a mass or bulging disc is very unlikely, as she has no pain. Likewise infection would be unlikely. Multiple sclerosis would be a consideration.  At this point it was not clear what was causing the patient's symptoms and therefore I asked for a Tele neurology consult. The neurologist felt that there was evidence of primarily sensory deficit at the level of T8. The primary concern would be possible MS, but other spinal cord lesions could be considered. It is reassuring that she is not experiencing any  pain and does not have fever. It was recommended that the patient be admitted to the hospital for MRI imaging in the morning. I discussed the case with Doctor Karilyn CotaGosrani, hospitalist on call. He expressed concern about the possibility of the patient in person and year and not being able to get imaging or have any subspecialists available at this hospital, as for the patient to be transferred to [facility].  I did discuss the case with Doctor Amada JupiterKirkpatrick, on call for neurology at [facility]. He did have similar conclusions, agreeing  that the patient would require imaging, but that it does not need to be performed emergently tonight. He did not recommend any empiric treatment, such as corticosteroids.  Was then discussed with Doctor Allena Katz, on-call for the hospitalist service at [facility]. He has accepted the patient for transfer. He did ask for head CT to assure that there were no brain abnormalities as well as to make sure that there were no lesions that were present lumbar puncture in the future if needed. This was ordered.  I discussed the findings and recommendations with the patient. I did this in conjunction with Doctor Karilyn Cota the hospitalist service. We both explained to her the significance of her situation. The differential diagnosis was discussed with the patient, including MS, Guillain-Barr, spinal cord tumor, spinal cord infection, cord compression. Patient became dismayed because her car was in the parking lot and her husband would need it in the morning. She asked to be able to drive her car home. I declined this, stating that if she was having difficulties with sensation in her legs, she was at risk for an accident. Patient was seen in the room speaking on the phone and then walked out of the ER. On her way out I asked her where she was going and she said she had to leave, she was "not ready for this". I did inform her that she was a risk to herself and others on the road and that her disease  could worsen and she could be paralyzed by morning. She says that she understands this but "I have to take care of my family". Patient says that she will have her husband drive her to [facility] tomorrow.    Gilda Crease, MD 05/09/13 4098  Gilda Crease, MD 05/09/13 2214

## 2013-05-09 NOTE — ED Notes (Signed)
MD at bedside. 

## 2013-05-09 NOTE — ED Notes (Signed)
Pt c/o bilateral leg numbness and tingling that started yesterday and has moved up to mid abd area today, denies any recent illness or injury. Numbness is worse on the right than on the left. Pt is ambulatory in triage room,

## 2013-05-09 NOTE — ED Notes (Signed)
[facility] called for [employer].  Paperwork faxed.  Monitor in the room

## 2013-05-09 NOTE — Progress Notes (Signed)
I saw this 28 year old lady in the emergency room. After taking a history and examining her, I have recommended admission. I've also warned her of not being admitted to the hospital with the possibility of paralysis which may be permanent. I've told her that she needs further investigation urgently and this is why she needs to be transferred to [facility]. I have found out that the patient left AMA despite recommendations to stay in the hospital by the emergency room physician and myself.

## 2013-05-09 NOTE — ED Notes (Signed)
Patient came back and had IV removed.  Reiterated to patient that if she gets worse during the night, that she needs to be seen.

## 2013-05-09 NOTE — ED Notes (Signed)
Computer placed in room for teleneurology

## 2013-05-09 NOTE — ED Notes (Signed)
Patient left before IV could be taken out.  Called patient's home number and was informed by her husband that she is on the way home.  I informed him that she left before having IV removed.  Patient's husband asked what if she didn't want to come back to have removed; I informed him that the police would be called and she could be picked up and brought back here to have removed.

## 2013-05-10 ENCOUNTER — Emergency Department (HOSPITAL_COMMUNITY): Payer: Self-pay

## 2013-05-10 ENCOUNTER — Encounter (HOSPITAL_COMMUNITY): Payer: Self-pay | Admitting: Emergency Medicine

## 2013-05-10 ENCOUNTER — Emergency Department (HOSPITAL_COMMUNITY)
Admission: EM | Admit: 2013-05-10 | Discharge: 2013-05-10 | Disposition: A | Discharge status: Home / Self Care | Payer: Self-pay | Attending: Emergency Medicine | Admitting: Emergency Medicine

## 2013-05-10 DIAGNOSIS — Z975 Presence of (intrauterine) contraceptive device: Secondary | ICD-10-CM | POA: Insufficient documentation

## 2013-05-10 DIAGNOSIS — F17200 Nicotine dependence, unspecified, uncomplicated: Secondary | ICD-10-CM | POA: Insufficient documentation

## 2013-05-10 DIAGNOSIS — M25569 Pain in unspecified knee: Secondary | ICD-10-CM | POA: Insufficient documentation

## 2013-05-10 DIAGNOSIS — R200 Anesthesia of skin: Secondary | ICD-10-CM

## 2013-05-10 DIAGNOSIS — F411 Generalized anxiety disorder: Secondary | ICD-10-CM | POA: Insufficient documentation

## 2013-05-10 DIAGNOSIS — F430 Acute stress reaction: Secondary | ICD-10-CM | POA: Insufficient documentation

## 2013-05-10 DIAGNOSIS — R202 Paresthesia of skin: Secondary | ICD-10-CM

## 2013-05-10 DIAGNOSIS — G8929 Other chronic pain: Secondary | ICD-10-CM | POA: Insufficient documentation

## 2013-05-10 DIAGNOSIS — R29818 Other symptoms and signs involving the nervous system: Secondary | ICD-10-CM

## 2013-05-10 DIAGNOSIS — R209 Unspecified disturbances of skin sensation: Secondary | ICD-10-CM | POA: Insufficient documentation

## 2013-05-10 DIAGNOSIS — M4120 Other idiopathic scoliosis, site unspecified: Secondary | ICD-10-CM | POA: Insufficient documentation

## 2013-05-10 MED ORDER — GADOBENATE DIMEGLUMINE 529 MG/ML IV SOLN
20.0000 mL | Freq: Once | INTRAVENOUS | Status: AC
  Administered 2013-05-10: 20 mL via INTRAVENOUS

## 2013-05-10 NOTE — ED Notes (Signed)
Family in to visit.

## 2013-05-10 NOTE — ED Notes (Signed)
Neuro here to see pt 

## 2013-05-10 NOTE — ED Notes (Signed)
Pt c/o bilateral leg and hip numbness. sts it started last night and she went to [facility], was seen there and was supposed to be admitted and then transferred to [facility] for an MRI. Pt left AMA. sts now the numbness is starting in bilateral hands and wants to have the MRI done now. Denies receiving a dx. Denies pain to any location. Pt ambulatory with no difficulties. Denies urinary/bowel issues. Nad, skin warm and dry, resp e/u.

## 2013-05-10 NOTE — ED Notes (Addendum)
Pt states that since Monday she went numb from waist down is able to move all extremities w/ purpose was seen at [facility] night and [facility] but pt had driven her familys car and could not leave husband w/o car so she drove here today states she can walk and drive but just feels numb

## 2013-05-10 NOTE — Consult Note (Signed)
NEURO HOSPITALIST CONSULT NOTE    Reason for Consult: bilateral decreased sensation which has progressed over two days  HPI:                                                                                                                                          Jessica [name] is an 28 y.o. female who noted upon waking on Monday morning she had decreased sensation in bilateral LE from her feet to hips.  She describes it as circumferential.  She worked on Monday and Tuesday and was able to walk without difficulty.  On Tuesday she noted the decreased sensation had moved cephalically to her mid chest circumferentially. She was seen in ED yesterday and recommended to be admitted but left AMA.  Today she noted the decreased sensation included her right hand along with previous sensory deficit, thus drove herself to ED.   She denies any incontinence but states she cannot feel when she wipes herself.  She notes she cannot feel herself urinate but has not urinated on herself.  He last BM was yesterday. Her description of decreased sensation is "I can feel pressure but I cannot feel the Pin Prick".  She denies any falls, decreased strength, vision abnormalities, speech difficulty.     Past Medical History  Diagnosis Date  . Chronic hip pain   . Scoliosis     Past Surgical History  Procedure Laterality Date  . Cholecystectomy    . Tonsillectomy      Family History  Problem Relation Age of Onset  . Hypertension Mother      Social History:  reports that she has been smoking Cigarettes.  She has been smoking about 0.50 packs per day. She does not have any smokeless tobacco history on file. She reports that she does not drink alcohol or use illicit drugs.  No Known Allergies  MEDICATIONS:                                                                                                                     No current facility-administered medications for this encounter.    Current Outpatient Prescriptions  Medication Sig Dispense Refill  . ibuprofen (ADVIL,MOTRIN) 200 MG tablet Take 600-800 mg by mouth every 6 (six) hours as needed for pain  or headache. Pain      . levonorgestrel (MIRENA) 20 MCG/24HR IUD 1 each by Intrauterine route once.            ROS:                                                                                                                                       History obtained from the patient  General ROS: negative for - chills, fatigue, fever, night sweats, weight gain or weight loss Psychological ROS: negative for - behavioral disorder, hallucinations, memory difficulties, mood swings or suicidal ideation Ophthalmic ROS: negative for - blurry vision, double vision, eye pain or loss of vision ENT ROS: negative for - epistaxis, nasal discharge, oral lesions, sore throat, tinnitus or vertigo Allergy and Immunology ROS: negative for - hives or itchy/watery eyes Hematological and Lymphatic ROS: negative for - bleeding problems, bruising or swollen lymph nodes Endocrine ROS: negative for - galactorrhea, hair pattern changes, polydipsia/polyuria or temperature intolerance Respiratory ROS: negative for - cough, hemoptysis, shortness of breath or wheezing Cardiovascular ROS: negative for - chest pain, dyspnea on exertion, edema or irregular heartbeat Gastrointestinal ROS: negative for - abdominal pain, diarrhea, hematemesis, nausea/vomiting or stool incontinence Genito-Urinary ROS: negative for - dysuria, hematuria, incontinence or urinary frequency/urgency Musculoskeletal ROS: negative for - joint swelling or muscular weakness Neurological ROS: as noted in HPI Dermatological ROS: negative for rash and skin lesion changes   Blood pressure 120/51, pulse 84, temperature 98.5 F (36.9 C), temperature source Oral, resp. rate 18, height 5\' 9"  (1.753 m), weight 139.209 kg (306 lb 14.4 oz), SpO2 98.00%.   Neurologic Examination:                                                                                                       Mental Status: Alert, oriented, thought content appropriate.  Speech fluent without evidence of aphasia.  Able to follow 3 step commands without difficulty. Cranial Nerves: II: Discs flat bilaterally; Visual fields grossly normal, pupils equal, round, reactive to light and accommodation III,IV, VI: ptosis not present, extra-ocular motions intact bilaterally V,VII: smile symmetric, facial light touch sensation normal bilaterally VIII: hearing normal bilaterally IX,X: gag reflex present XI: bilateral shoulder shrug XII: midline tongue extension without atrophy or fasciculations  Motor: Right : Upper extremity   5/5    Left:     Upper extremity   5/5  Lower extremity   5/5     Lower extremity   5/5 Tone  and bulk:normal tone throughout; no atrophy noted Sensory: decreased sensation to pinprick from mid abdomen to feet from ~T7 below, intact vibratory, intact proprioception, intact temperature.  Deep Tendon Reflexes:  Right: Upper Extremity   Left: Upper extremity   biceps (C-5 to C-6) 2/4   biceps (C-5 to C-6) 2/4 tricep (C7) 2/4    triceps (C7) 2/4 Brachioradialis (C6) 2/4  Brachioradialis (C6) 2/4  Lower Extremity Lower Extremity  quadriceps (L-2 to L-4) 2/4   quadriceps (L-2 to L-4) 2/4 Achilles (S1) 2/4   Achilles (S1) 2/4  Plantars: Right: downgoing   Left: downgoing Cerebellar: normal finger-to-nose,  normal heel-to-shin test Gait:  CV: pulses palpable throughout      Lab Results: Basic Metabolic Panel:  Recent Labs Lab 05/09/13 1810  NA 141  K 3.7  CL 104  CO2 26  GLUCOSE 86  BUN 15  CREATININE 1.14*  CALCIUM 9.0    Liver Function Tests: No results found for this basename: AST, ALT, ALKPHOS, BILITOT, PROT, ALBUMIN,  in the last 168 hours No results found for this basename: LIPASE, AMYLASE,  in the last 168 hours No results found for this basename: AMMONIA,  in the  last 168 hours  CBC:  Recent Labs Lab 05/09/13 1810  WBC 9.5  NEUTROABS 6.0  HGB 12.8  HCT 37.9  MCV 90.0  PLT 206    Cardiac Enzymes:  Recent Labs Lab 05/09/13 1810  CKTOTAL 103    Lipid Panel: No results found for this basename: CHOL, TRIG, HDL, CHOLHDL, VLDL, LDLCALC,  in the last 168 hours  CBG: No results found for this basename: GLUCAP,  in the last 168 hours  Microbiology: Results for orders placed during the hospital encounter of 08/31/10  URINE CULTURE     Status: None   Collection Time    08/31/10 11:11 AM      Result Value Ref Range Status   Specimen Description URINE, CLEAN CATCH   Final   Special Requests NONE   Final   Culture  Setup Time [id number]   Final   Colony Count >=100,000 COLONIES/ML   Final   Culture ESCHERICHIA COLI   Final   Report Status 09/03/2010 FINAL   Final   Organism ID, Bacteria ESCHERICHIA COLI   Final    Coagulation Studies: No results found for this basename: LABPROT, INR,  in the last 72 hours  Imaging: Dg Cervical Spine Complete  05/09/2013   CLINICAL DATA Bilateral leg pain and numbness  EXAM CERVICAL SPINE  4+ VIEWS  COMPARISON None.  FINDINGS There is no evidence of cervical spine fracture or prevertebral soft tissue swelling. There is straightening of cervical spine. No other significant bone abnormalities are identified.  IMPRESSION No acute fracture or dislocation. No significant degenerative joint changes are noted  SIGNATURE  Electronically Signed   By: Sherian Rein M.D.   On: 05/09/2013 21:29   Dg Thoracic Spine 2 View  05/09/2013   CLINICAL DATA Bilateral leg pain and numbness  EXAM THORACIC SPINE - 2 VIEW  COMPARISON None.  FINDINGS There is no evidence of thoracic spine fracture. There is scoliosis of spine. There are minimal degenerative joint changes of the mid thoracic spine with anterior osteophytosis.  IMPRESSION No acute fracture or dislocation. Scoliosis of spine. Mild degenerative joint changes of  spine.  SIGNATURE  Electronically Signed   By: Sherian Rein M.D.   On: 05/09/2013 21:30    Felicie Morn PA-C [facility] [phone]  05/10/2013, 2:03 PM  Patient seen and examined.  Clinical course and management discussed.  Necessary edits performed.  I agree with the above.  Assessment and plan of care developed and discussed below.    Assessment/Plan: 28 year old female presenting with progressive paresthesias.  She has maintained her proprioception, DTR's and strength.  Has not been febrile, no pain and elevated wbc count.  She has no associated bowel or bladder incontinence.  Has a poorly defined lower thoracic sensory level on examination.  Although GBS less likely with preserved reflexes, would consider a cord lesion, i.e. MS.  Further work up recommended.  Lab work performed at [facility] on last evening was unremarkable.    Recommendations: 1.  MRI of the thoracic and lumbar spine with and without contrast 2.  Will follow up after above imaging with further recommendations to be performed at that time.    Case discussed with Dr. Geanie BerlinYao  Keontae Levingston, MD [facility] [phone]  05/10/2013  3:52 PM

## 2013-05-10 NOTE — ED Notes (Signed)
Per MRI it is 1 1/2 wait till MRI

## 2013-05-10 NOTE — Discharge Instructions (Signed)
Follow up with [facility] neurology.   Do not drive if you feel weak.   Return to ER if you have worse numbness, weakness.

## 2013-05-10 NOTE — ED Provider Notes (Signed)
CSN: [id number]     Arrival date & time 05/10/13  1049 History   First MD Initiated Contact with Patient 05/10/13 1219     Chief Complaint  Patient presents with  . Numbness     (Consider location/radiation/quality/duration/timing/severity/associated sxs/prior Treatment) The history is provided by the patient.  Jessica [name] is a 28 y.o. female hx of chronic hip pain, scoliosis here with numbness. Bilateral leg numbness started 2 days ago. Then progressed to her right arm today. She went to [facility] yesterday and was seen in the ER. Telemetry neurology was consulted recommend an MRI of the spine to rule out possible multiple sclerosis vs mass vs cord compression vs GBS. However she signed out [place] because she wanted to drive her car back. She drove into the hospital today. She states that she has a lot of stress in her life recently. She had normal labs, including ESR yesterday. Denies being pregnant.   Past Medical History  Diagnosis Date  . Chronic hip pain   . Scoliosis    Past Surgical History  Procedure Laterality Date  . Cholecystectomy    . Tonsillectomy     Family History  Problem Relation Age of Onset  . Hypertension Mother    History  Substance Use Topics  . Smoking status: Current Every Day Smoker -- 0.50 packs/day    Types: Cigarettes  . Smokeless tobacco: Not on file  . Alcohol Use: No   OB History   Grav Para Term Preterm Abortions TAB SAB Ect Mult Living                 Review of Systems  Neurological: Positive for numbness.  All other systems reviewed and are negative.      Allergies  Review of patient's allergies indicates no known allergies.  Home Medications   Current Outpatient Rx  Name  Route  Sig  Dispense  Refill  . ibuprofen (ADVIL,MOTRIN) 200 MG tablet   Oral   Take 600-800 mg by mouth every 6 (six) hours as needed for pain or headache. Pain         . levonorgestrel (MIRENA) 20 MCG/24HR IUD   Intrauterine   1  each by Intrauterine route once.            BP 110/58  Pulse 63  Temp(Src) 98.9 F (37.2 C) (Oral)  Resp 20  Ht '5\' 9"'  (1.753 m)  Wt 306 lb 14.4 oz (139.209 kg)  BMI 45.30 kg/m2  SpO2 99% Physical Exam  Nursing note and vitals reviewed. Constitutional: She is oriented to person, place, and time. She appears well-developed and well-nourished.  Slightly anxious   HENT:  Head: Normocephalic.  Mouth/Throat: Oropharynx is clear and moist.  Eyes: Conjunctivae are normal. Pupils are equal, round, and reactive to light.  Neck: Normal range of motion. Neck supple.  Cardiovascular: Normal rate, regular rhythm and normal heart sounds.   Pulmonary/Chest: Effort normal and breath sounds normal. No respiratory distress. She has no wheezes. She has no rales.  Abdominal: Soft. Bowel sounds are normal. She exhibits no distension. There is no tenderness. There is no rebound and no guarding.  Musculoskeletal: Normal range of motion. She exhibits no edema and no tenderness.  Neurological: She is alert and oriented to person, place, and time.  Dec sensation bilateral lower extremity. Dec sensation R hand. Nl strength throughout. Nl reflexes.   Skin: Skin is warm and dry.  Psychiatric: She has a normal mood and affect. Her  behavior is normal. Judgment and thought content normal.    ED Course  Procedures (including critical care time) Labs Review Labs Reviewed - No data to display Imaging Review Dg Cervical Spine Complete  05/09/2013   CLINICAL DATA Bilateral leg pain and numbness  EXAM CERVICAL SPINE  4+ VIEWS  COMPARISON None.  FINDINGS There is no evidence of cervical spine fracture or prevertebral soft tissue swelling. There is straightening of cervical spine. No other significant bone abnormalities are identified.  IMPRESSION No acute fracture or dislocation. No significant degenerative joint changes are noted  SIGNATURE  Electronically Signed   By: Abelardo Diesel M.D.   On: 05/09/2013 21:29    Dg Thoracic Spine 2 View  05/09/2013   CLINICAL DATA Bilateral leg pain and numbness  EXAM THORACIC SPINE - 2 VIEW  COMPARISON None.  FINDINGS There is no evidence of thoracic spine fracture. There is scoliosis of spine. There are minimal degenerative joint changes of the mid thoracic spine with anterior osteophytosis.  IMPRESSION No acute fracture or dislocation. Scoliosis of spine. Mild degenerative joint changes of spine.  SIGNATURE  Electronically Signed   By: Abelardo Diesel M.D.   On: 05/09/2013 21:30   Mr Thoracic Spine W Wo Contrast  05/10/2013   CLINICAL DATA BILATERAL LEG AND HIP NUMBNESS. NUMBNESS INTO THE HANDS BILATERALLY. SYMPTOMS BEGAN LAST EVENING.  EXAM MRI THORACIC AND LUMBAR SPINE WITHOUT AND WITH CONTRAST  TECHNIQUE Multiplanar and multiecho pulse sequences of the thoracic and lumbar spine were obtained without and with intravenous contrast.  CONTRAST 40m MULTIHANCE GADOBENATE DIMEGLUMINE 529 MG/ML IV SOLN  COMPARISON Thoracic spine radiographs 05/09/2013.  FINDINGS MR THORACIC SPINE FINDINGS  Marrow signal is diffusely depressed. There is no focal enhancement or discrete lesion. Vertebral body heights and alignment are maintained. Scoliosis of the upper thoracic spine is again noted with leftward curvature centered at T3 and rightward curvature at T7.  No significant disc protrusion or focal stenosis is present. The foramina are patent bilaterally.  Apparent cord abnormality anteriorly at T2-3 and image 9 of series 600 is likely artifactual. There is significant pulsation artifact on this image. The finding cannot be confirmed on the sagittal images. No other cord signal abnormality is evident.  The postcontrast axial images are severely degraded by motion. There is no pathologic enhancement on the sagittal images.  MR LUMBAR SPINE FINDINGS  Normal signal is present in the distal cord to the conus medullaris which terminates at L1-2, within normal limits. Marrow signal appears more normal  on the lumbar spine imaging. Limited imaging of the abdomen is unremarkable.  No significant disc protrusion or focal stenosis is present. The foramina are patent bilaterally.  Postcontrast images demonstrate no pathologic enhancement.  IMPRESSION 1. Normal appearance of the spinal cord from the cervical thoracic junction through the conus medullaris at L1-2. 2. Scoliosis of the upper thoracic spine. 3. No focal enhancement or lesion to explain the patient's symptoms. 4. Focal distortion of the spinal cord at T2-3 on the axial images is likely artifactual. This could be confirmed with MRI of the cervical spine if indicated. 5. Portions of the thoracic study are significantly degraded by patient motion. 6. T1 signal abnormality is more apparent on the thoracic and lumbar spine images. No focal lesions are evident. The patient is not anemic. This may be related to obesity.  SIGNATURE  Electronically Signed   By: CLawrence SantiagoM.D.   On: 05/10/2013 19:37   Mr Lumbar Spine W Wo Contrast  05/10/2013   CLINICAL DATA BILATERAL LEG AND HIP NUMBNESS. NUMBNESS INTO THE HANDS BILATERALLY. SYMPTOMS BEGAN LAST EVENING.  EXAM MRI THORACIC AND LUMBAR SPINE WITHOUT AND WITH CONTRAST  TECHNIQUE Multiplanar and multiecho pulse sequences of the thoracic and lumbar spine were obtained without and with intravenous contrast.  CONTRAST 70m MULTIHANCE GADOBENATE DIMEGLUMINE 529 MG/ML IV SOLN  COMPARISON Thoracic spine radiographs 05/09/2013.  FINDINGS MR THORACIC SPINE FINDINGS  Marrow signal is diffusely depressed. There is no focal enhancement or discrete lesion. Vertebral body heights and alignment are maintained. Scoliosis of the upper thoracic spine is again noted with leftward curvature centered at T3 and rightward curvature at T7.  No significant disc protrusion or focal stenosis is present. The foramina are patent bilaterally.  Apparent cord abnormality anteriorly at T2-3 and image 9 of series 600 is likely artifactual. There  is significant pulsation artifact on this image. The finding cannot be confirmed on the sagittal images. No other cord signal abnormality is evident.  The postcontrast axial images are severely degraded by motion. There is no pathologic enhancement on the sagittal images.  MR LUMBAR SPINE FINDINGS  Normal signal is present in the distal cord to the conus medullaris which terminates at L1-2, within normal limits. Marrow signal appears more normal on the lumbar spine imaging. Limited imaging of the abdomen is unremarkable.  No significant disc protrusion or focal stenosis is present. The foramina are patent bilaterally.  Postcontrast images demonstrate no pathologic enhancement.  IMPRESSION 1. Normal appearance of the spinal cord from the cervical thoracic junction through the conus medullaris at L1-2. 2. Scoliosis of the upper thoracic spine. 3. No focal enhancement or lesion to explain the patient's symptoms. 4. Focal distortion of the spinal cord at T2-3 on the axial images is likely artifactual. This could be confirmed with MRI of the cervical spine if indicated. 5. Portions of the thoracic study are significantly degraded by patient motion. 6. T1 signal abnormality is more apparent on the thoracic and lumbar spine images. No focal lesions are evident. The patient is not anemic. This may be related to obesity.  SIGNATURE  Electronically Signed   By: CLawrence SantiagoM.D.   On: 05/10/2013 19:37     EKG Interpretation None      MDM   Final diagnoses:  Neurosensory deficit   Vira AOuida Sillsis a 28y.o. female here with numbness. Has inconsistent exam for GBS vs MS. No signs of cord compression. Neurology consulted for further workup evaluation.   2:30 PM Neuro recommend MRI thoracic/lumbar given possible sensory level.   8:00 PM MRI showed no impingement on the spinal cord. Will d/c home with neuro f/u.   DWandra Arthurs MD 05/10/13 2[address]

## 2013-12-09 ENCOUNTER — Encounter (HOSPITAL_COMMUNITY): Payer: Self-pay | Admitting: Emergency Medicine

## 2013-12-09 ENCOUNTER — Emergency Department (INDEPENDENT_AMBULATORY_CARE_PROVIDER_SITE_OTHER)
Admission: EM | Admit: 2013-12-09 | Discharge: 2013-12-09 | Disposition: A | Discharge status: Home / Self Care | Payer: Self-pay | Source: Home / Self Care | Attending: Emergency Medicine | Admitting: Emergency Medicine

## 2013-12-09 DIAGNOSIS — N390 Urinary tract infection, site not specified: Secondary | ICD-10-CM

## 2013-12-09 LAB — POCT URINALYSIS DIP (DEVICE)
Bilirubin Urine: NEGATIVE
GLUCOSE, UA: NEGATIVE mg/dL
Ketones, ur: NEGATIVE mg/dL
Nitrite: POSITIVE — AB
Protein, ur: 30 mg/dL — AB
SPECIFIC GRAVITY, URINE: 1.025 (ref 1.005–1.030)
Urobilinogen, UA: 0.2 mg/dL (ref 0.0–1.0)
pH: 6 (ref 5.0–8.0)

## 2013-12-09 MED ORDER — CEPHALEXIN 500 MG PO CAPS: 500.0000 mg | ORAL_CAPSULE | Freq: Three times a day (TID) | ORAL | Status: DC

## 2013-12-09 NOTE — ED Notes (Signed)
C/o  Urinary frequency and dysuria.  Denies any other symptoms.  On set Wednesday.  No otc treatments tried.

## 2013-12-09 NOTE — Discharge Instructions (Signed)
You have a bladder infection or UTI. Take Keflex one pill 3 times a day for 3 days. Drink enough water to keep your urine clear.  We sent the urine for culture. If we need to change antibiotics someone will call you. If you develop fevers, chills, flank pain please return for additional evaluation.

## 2013-12-09 NOTE — ED Provider Notes (Signed)
CSN: [id number]     Arrival date & time 12/09/13  1530 History   First MD Initiated Contact with Patient 12/09/13 1622     Chief Complaint  Patient presents with  . Urinary Tract Infection   (Consider location/radiation/quality/duration/timing/severity/associated sxs/prior Treatment) HPI She is a 28 year old woman here for evaluation of dysuria. She states she started having dysuria, frequency, urgency on Wednesday. It worsened on Thursday. She denies any abdominal pain, flank pain, fevers, chills, nausea, vomiting. She has had multiple urinary tract infections in the past. She states they seem to occur after she drinks multiple sodas and the same day.  Past Medical History  Diagnosis Date  . Chronic hip pain   . Scoliosis    Past Surgical History  Procedure Laterality Date  . Cholecystectomy    . Tonsillectomy     Family History  Problem Relation Age of Onset  . Hypertension Mother    History  Substance Use Topics  . Smoking status: Current Every Day Smoker -- 0.50 packs/day    Types: Cigarettes  . Smokeless tobacco: Not on file  . Alcohol Use: Yes   OB History   Grav Para Term Preterm Abortions TAB SAB Ect Mult Living                 Review of Systems  Constitutional: Negative.   Gastrointestinal: Negative.   Genitourinary: Positive for dysuria, urgency and frequency. Negative for hematuria and flank pain.    Allergies  Review of patient's allergies indicates no known allergies.  Home Medications   Prior to Admission medications   Medication Sig Start Date End Date Taking? Authorizing Provider  cephALEXin (KEFLEX) 500 MG capsule Take 1 capsule (500 mg total) by mouth 3 (three) times daily. 12/09/13   Charm RingsErin J Honig, MD  ibuprofen (ADVIL,MOTRIN) 200 MG tablet Take 600-800 mg by mouth every 6 (six) hours as needed for pain or headache. Pain    Historical Provider, MD  levonorgestrel (MIRENA) 20 MCG/24HR IUD 1 each by Intrauterine route once.      Historical  Provider, MD   BP 119/78  Pulse 97  Temp(Src) 98.5 F (36.9 C) (Oral)  Resp 16  SpO2 97%  LMP 12/02/2013 Physical Exam  Constitutional: She is oriented to person, place, and time. She appears well-developed and well-nourished. No distress.  Abdominal: Soft. Bowel sounds are normal. She exhibits no distension. There is no tenderness. There is no rebound and no guarding.  No CVA tenderness  Neurological: She is alert and oriented to person, place, and time.  Skin: Skin is warm and dry.    ED Course  Procedures (including critical care time) Labs Review Labs Reviewed  POCT URINALYSIS DIP (DEVICE) - Abnormal; Notable for the following:    Hgb urine dipstick MODERATE (*)    Protein, ur 30 (*)    Nitrite POSITIVE (*)    Leukocytes, UA SMALL (*)    All other components within normal limits  URINE CULTURE    Imaging Review No results found.   MDM   1. UTI (lower urinary tract infection)    Urinalysis is consistent with infection. We'll send urine for culture. Keflex 3 times a day for 3 days. Reasons to return reviewed as in after visit summary. Followup as needed.    Charm RingsErin J Honig, MD 12/09/13 [phone]

## 2013-12-13 LAB — URINE CULTURE

## 2013-12-14 NOTE — ED Notes (Signed)
Urine culture: >100,000 colonies E. Coli.  Pt. adequately treated with Keflex. Vassie Moselle 12/14/2013

## 2014-03-21 ENCOUNTER — Emergency Department (HOSPITAL_COMMUNITY): Payer: Self-pay

## 2014-03-21 ENCOUNTER — Encounter (HOSPITAL_COMMUNITY): Payer: Self-pay

## 2014-03-21 ENCOUNTER — Emergency Department (HOSPITAL_COMMUNITY)
Admission: EM | Admit: 2014-03-21 | Discharge: 2014-03-21 | Disposition: A | Discharge status: Home / Self Care | Payer: Self-pay | Attending: Emergency Medicine | Admitting: Emergency Medicine

## 2014-03-21 DIAGNOSIS — R1010 Upper abdominal pain, unspecified: Secondary | ICD-10-CM

## 2014-03-21 DIAGNOSIS — Z8739 Personal history of other diseases of the musculoskeletal system and connective tissue: Secondary | ICD-10-CM | POA: Insufficient documentation

## 2014-03-21 DIAGNOSIS — Z9049 Acquired absence of other specified parts of digestive tract: Secondary | ICD-10-CM | POA: Insufficient documentation

## 2014-03-21 DIAGNOSIS — Z3202 Encounter for pregnancy test, result negative: Secondary | ICD-10-CM | POA: Insufficient documentation

## 2014-03-21 DIAGNOSIS — Z792 Long term (current) use of antibiotics: Secondary | ICD-10-CM | POA: Insufficient documentation

## 2014-03-21 DIAGNOSIS — G8929 Other chronic pain: Secondary | ICD-10-CM | POA: Insufficient documentation

## 2014-03-21 DIAGNOSIS — R109 Unspecified abdominal pain: Secondary | ICD-10-CM | POA: Insufficient documentation

## 2014-03-21 LAB — LIPASE, BLOOD: Lipase: 19 U/L (ref 11–59)

## 2014-03-21 LAB — CBC WITH DIFFERENTIAL/PLATELET
Basophils Absolute: 0 10*3/uL (ref 0.0–0.1)
Basophils Relative: 0 % (ref 0–1)
EOS PCT: 1 % (ref 0–5)
Eosinophils Absolute: 0.1 10*3/uL (ref 0.0–0.7)
HCT: 40.4 % (ref 36.0–46.0)
Hemoglobin: 13.3 g/dL (ref 12.0–15.0)
Lymphocytes Relative: 28 % (ref 12–46)
Lymphs Abs: 2.8 10*3/uL (ref 0.7–4.0)
MCH: 30.9 pg (ref 26.0–34.0)
MCHC: 32.9 g/dL (ref 30.0–36.0)
MCV: 93.7 fL (ref 78.0–100.0)
MONO ABS: 0.5 10*3/uL (ref 0.1–1.0)
Monocytes Relative: 5 % (ref 3–12)
NEUTROS ABS: 6.7 10*3/uL (ref 1.7–7.7)
NEUTROS PCT: 66 % (ref 43–77)
Platelets: 203 10*3/uL (ref 150–400)
RBC: 4.31 MIL/uL (ref 3.87–5.11)
RDW: 13.7 % (ref 11.5–15.5)
WBC: 10.2 10*3/uL (ref 4.0–10.5)

## 2014-03-21 LAB — COMPREHENSIVE METABOLIC PANEL
ALK PHOS: 40 U/L (ref 39–117)
ALT: 17 U/L (ref 0–35)
ANION GAP: 5 (ref 5–15)
AST: 14 U/L (ref 0–37)
Albumin: 3.7 g/dL (ref 3.5–5.2)
BUN: 17 mg/dL (ref 6–23)
CALCIUM: 8.7 mg/dL (ref 8.4–10.5)
CHLORIDE: 105 meq/L (ref 96–112)
CO2: 27 mmol/L (ref 19–32)
Creatinine, Ser: 0.7 mg/dL (ref 0.50–1.10)
GFR calc non Af Amer: >90 mL/min (ref >90)
Glucose, Bld: 94 mg/dL (ref 70–99)
Potassium: 3.8 mmol/L (ref 3.5–5.1)
Sodium: 137 mmol/L (ref 135–145)
Total Bilirubin: 0.6 mg/dL (ref 0.3–1.2)
Total Protein: 6.5 g/dL (ref 6.0–8.3)

## 2014-03-21 LAB — URINALYSIS, ROUTINE W REFLEX MICROSCOPIC
BILIRUBIN URINE: NEGATIVE
Glucose, UA: NEGATIVE mg/dL
HGB URINE DIPSTICK: NEGATIVE
KETONES UR: NEGATIVE mg/dL
Leukocytes, UA: NEGATIVE
Nitrite: NEGATIVE
Protein, ur: NEGATIVE mg/dL
Specific Gravity, Urine: >1.03 — ABNORMAL HIGH (ref 1.005–1.030)
UROBILINOGEN UA: 0.2 mg/dL (ref 0.0–1.0)
pH: 6 (ref 5.0–8.0)

## 2014-03-21 LAB — PREGNANCY, URINE: PREG TEST UR: NEGATIVE

## 2014-03-21 LAB — RAPID URINE DRUG SCREEN, HOSP PERFORMED
Amphetamines: NOT DETECTED
BENZODIAZEPINES: NOT DETECTED
Barbiturates: NOT DETECTED
Cocaine: NOT DETECTED
Opiates: POSITIVE — AB
Tetrahydrocannabinol: POSITIVE — AB

## 2014-03-21 MED ORDER — FENTANYL CITRATE 0.05 MG/ML IJ SOLN
25.0000 ug | Freq: Once | INTRAMUSCULAR | Status: AC
  Administered 2014-03-21: 25 ug via INTRAVENOUS
  Filled 2014-03-21: qty 2

## 2014-03-21 MED ORDER — SODIUM CHLORIDE 0.9 % IV SOLN: INTRAVENOUS | Status: DC

## 2014-03-21 MED ORDER — PROMETHAZINE HCL 25 MG PO TABS: 25.0000 mg | ORAL_TABLET | Freq: Four times a day (QID) | ORAL | Status: DC | PRN

## 2014-03-21 MED ORDER — SODIUM CHLORIDE 0.9 % IV BOLUS (SEPSIS)
1000.0000 mL | Freq: Once | INTRAVENOUS | Status: AC
  Administered 2014-03-21: 1000 mL via INTRAVENOUS

## 2014-03-21 MED ORDER — IOHEXOL 300 MG/ML  SOLN
50.0000 mL | Freq: Once | INTRAMUSCULAR | Status: AC | PRN
  Administered 2014-03-21: 50 mL via ORAL

## 2014-03-21 MED ORDER — METOCLOPRAMIDE HCL 5 MG/ML IJ SOLN
10.0000 mg | Freq: Once | INTRAMUSCULAR | Status: AC
  Administered 2014-03-21: 10 mg via INTRAVENOUS
  Filled 2014-03-21: qty 2

## 2014-03-21 MED ORDER — DIPHENHYDRAMINE HCL 50 MG/ML IJ SOLN
25.0000 mg | Freq: Once | INTRAMUSCULAR | Status: AC
  Administered 2014-03-21: 25 mg via INTRAVENOUS
  Filled 2014-03-21: qty 1

## 2014-03-21 MED ORDER — IOHEXOL 300 MG/ML  SOLN
100.0000 mL | Freq: Once | INTRAMUSCULAR | Status: AC | PRN
  Administered 2014-03-21: 100 mL via INTRAVENOUS

## 2014-03-21 NOTE — ED Provider Notes (Signed)
CSN: [id number]     Arrival date & time 03/21/14  0549 History   First MD Initiated Contact with Patient 03/21/14 [phone]     Chief Complaint  Patient presents with  . Abdominal Pain     (Consider location/radiation/quality/duration/timing/severity/associated sxs/prior Treatment) HPI  Patient states this evening she started having what she thought was an upset stomach. She states for dinner she ate a salad, homemade strawberry shortcake, and chicken wings with hot sauce that she bought from [facility]. 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 She reports she spent the evening in the emergency department with her boyfriend who has sickle cell disease with multiple ED visits. She states about 11 PM she started getting worsening pain. She has had nausea without vomiting or diarrhea. She denies fever. She states laying flat and standing up makes the pain worse. Sitting makes the pain feel a little bit better. She states the pain feels similar to when she had pain before her cholecystectomy was done. She reports she had a stent placed after her cholecystectomy.  PCP none  Past Medical History  Diagnosis Date  . Chronic hip pain   . Scoliosis    Past Surgical History  Procedure Laterality Date  . Cholecystectomy    . Tonsillectomy     Family History  Problem Relation Age of Onset  . Hypertension Mother    History  Substance Use Topics  . Smoking status: Current Every Day Smoker -- 0.50 packs/day    Types: Cigarettes  . Smokeless tobacco: Not on file  . 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     Allergies  Review of patient's allergies indicates no known allergies.  Home Medications   Prior to Admission medications   Medication Sig Start Date End Date Taking? Authorizing  Provider  cephALEXin (KEFLEX) 500 MG capsule Take 1 capsule (500 mg total) by mouth 3 (three) times daily. 12/09/13   Charm Rings, MD  ibuprofen (ADVIL,MOTRIN) 200 MG tablet Take 600-800 mg by mouth every 6 (six) hours as needed for pain or headache. Pain    Historical Provider, MD  levonorgestrel (MIRENA) 20 MCG/24HR IUD 1 each by Intrauterine route once.      Historical Provider, MD   BP 122/47 mmHg  Pulse 80  Temp(Src) 98 F (36.7 C)  Resp 18  Ht 5' 8.5" (1.74 m)  Wt 230 lb (104.327 kg)  BMI 34.46 kg/m2  SpO2 100%  LMP 03/12/2014  Vital signs normal   Physical Exam  Constitutional: She is oriented to person, place, and time. She appears well-developed and well-nourished.  Non-toxic appearance. She does not appear ill. No distress.  HENT:  Head: Normocephalic and atraumatic.  Right Ear: External ear normal.  Left Ear: External ear normal.  Nose: Nose normal. No mucosal edema or rhinorrhea.  Mouth/Throat: Oropharynx is clear and moist and mucous membranes are normal. No dental abscesses or uvula swelling.  Tongue dry  Eyes: Conjunctivae and EOM are normal. Pupils are equal, round, and reactive to light.  Neck: Normal range of motion and full passive range of motion without pain. Neck supple.  Cardiovascular: Normal rate, regular rhythm and normal heart sounds.  Exam reveals no gallop and no friction rub.   No  murmur heard. Pulmonary/Chest: Effort normal and breath sounds normal. No respiratory distress. She has no wheezes. She has no rhonchi. She has no rales. She exhibits no tenderness and no crepitus.  Abdominal: Soft. Normal appearance and bowel sounds are normal. She exhibits no distension. There is tenderness. There is no rebound and no guarding.    Mild diffuse upper abdominal pain but appears most painful in the epigastric and medial right upper quadrant.  Musculoskeletal: Normal range of motion. She exhibits no edema or tenderness.  Moves all extremities well.     Neurological: She is alert and oriented to person, place, and time. She has normal strength. No cranial nerve deficit.  Skin: Skin is warm, dry and intact. No rash noted. No erythema. No pallor.  Psychiatric: She has a normal mood and affect. Her speech is normal and behavior is normal. Her mood appears not anxious.  Nursing note and vitals reviewed.   ED Course  Procedures (including critical care time)  Medications  0.9 %  sodium chloride infusion (not administered)  sodium chloride 0.9 % bolus 1,000 mL (1,000 mLs Intravenous New Bag/Given 03/21/14 0642)  metoCLOPramide (REGLAN) injection 10 mg (10 mg Intravenous Given 03/21/14 0641)  diphenhydrAMINE (BENADRYL) injection 25 mg (25 mg Intravenous Given 03/21/14 0641)  fentaNYL (SUBLIMAZE) injection 25 mcg (25 mcg Intravenous Given 03/21/14 0641)  iohexol (OMNIPAQUE) 300 MG/ML solution 50 mL (50 mLs Oral Contrast Given 03/21/14 0640)   We discussed treatment plan. We are going to proceed with CT scan of her abdomen. If that is negative she may require an ultrasound which will not be available until after 7:30 or 8 AM this morning. Patient's boyfriend is sitting at her bedside playing video games in no distress.  Pt left at change of shift with Dr Deretha Emory at change of shift to get CT results.   Labs Review Results for orders placed or performed during the hospital encounter of 03/21/14  Comprehensive metabolic panel  Result Value Ref Range   Sodium 137 135 - 145 mmol/L   Potassium 3.8 3.5 - 5.1 mmol/L   Chloride 105 96 - 112 mEq/L   CO2 27 19 - 32 mmol/L   Glucose, Bld 94 70 - 99 mg/dL   BUN 17 6 - 23 mg/dL   Creatinine, Ser 8.11 0.50 - 1.10 mg/dL   Calcium 8.7 8.4 - 91.4 mg/dL   Total Protein 6.5 6.0 - 8.3 g/dL   Albumin 3.7 3.5 - 5.2 g/dL   AST 14 0 - 37 U/L   ALT 17 0 - 35 U/L   Alkaline Phosphatase 40 39 - 117 U/L   Total Bilirubin 0.6 0.3 - 1.2 mg/dL   GFR calc non Af Amer >90 >90 mL/min   GFR calc Af Amer >90 >90 mL/min    Anion gap 5 5 - 15  CBC with Differential  Result Value Ref Range   WBC 10.2 4.0 - 10.5 K/uL   RBC 4.31 3.87 - 5.11 MIL/uL   Hemoglobin 13.3 12.0 - 15.0 g/dL   HCT 78.2 95.6 - 21.3 %   MCV 93.7 78.0 - 100.0 fL   MCH 30.9 26.0 - 34.0 pg   MCHC 32.9 30.0 - 36.0 g/dL   RDW 08.6 57.8 - 46.9 %   Platelets 203 150 - 400 K/uL   Neutrophils Relative % 66 43 - 77 %   Neutro Abs 6.7 1.7 - 7.7 K/uL   Lymphocytes Relative 28 12 - 46 %   Lymphs Abs 2.8 0.7 -  4.0 K/uL   Monocytes Relative 5 3 - 12 %   Monocytes Absolute 0.5 0.1 - 1.0 K/uL   Eosinophils Relative 1 0 - 5 %   Eosinophils Absolute 0.1 0.0 - 0.7 K/uL   Basophils Relative 0 0 - 1 %   Basophils Absolute 0.0 0.0 - 0.1 K/uL  Lipase, blood  Result Value Ref Range   Lipase 19 11 - 59 U/L   Laboratory interpretation all normal      Imaging Review No results found.  AP CT pending   EKG Interpretation None      MDM   Final diagnoses:  Upper abdominal pain    `disposition pending   Devoria Albe, MD, Armando Gang     Ward Givens, MD 03/21/14 [phone]

## 2014-03-21 NOTE — ED Notes (Signed)
Pt is at CT.

## 2014-03-21 NOTE — Discharge Instructions (Signed)
Follow-up with GI medicine as we discussed. Also resource guide provided below to help you find a local medical care. You may need to follow-up with the health department in order to get in to see GI. Dr. Patty Sermons office number provided.

## 2014-03-21 NOTE — ED Notes (Signed)
Pt reports abd pain that started last night, states pain is intermittent but worse with laying down, denies uti symptoms

## 2014-08-02 ENCOUNTER — Emergency Department (HOSPITAL_COMMUNITY)
Admission: EM | Admit: 2014-08-02 | Discharge: 2014-08-02 | Disposition: A | Discharge status: Home / Self Care | Payer: Self-pay | Attending: Emergency Medicine | Admitting: Emergency Medicine

## 2014-08-02 ENCOUNTER — Encounter (HOSPITAL_COMMUNITY): Payer: Self-pay

## 2014-08-02 DIAGNOSIS — N390 Urinary tract infection, site not specified: Secondary | ICD-10-CM | POA: Insufficient documentation

## 2014-08-02 DIAGNOSIS — B9689 Other specified bacterial agents as the cause of diseases classified elsewhere: Secondary | ICD-10-CM

## 2014-08-02 DIAGNOSIS — Z791 Long term (current) use of non-steroidal anti-inflammatories (NSAID): Secondary | ICD-10-CM | POA: Insufficient documentation

## 2014-08-02 DIAGNOSIS — Z720 Tobacco use: Secondary | ICD-10-CM | POA: Insufficient documentation

## 2014-08-02 DIAGNOSIS — M419 Scoliosis, unspecified: Secondary | ICD-10-CM | POA: Insufficient documentation

## 2014-08-02 DIAGNOSIS — Z3202 Encounter for pregnancy test, result negative: Secondary | ICD-10-CM | POA: Insufficient documentation

## 2014-08-02 DIAGNOSIS — Z79899 Other long term (current) drug therapy: Secondary | ICD-10-CM | POA: Insufficient documentation

## 2014-08-02 DIAGNOSIS — N760 Acute vaginitis: Secondary | ICD-10-CM | POA: Insufficient documentation

## 2014-08-02 DIAGNOSIS — G8929 Other chronic pain: Secondary | ICD-10-CM | POA: Insufficient documentation

## 2014-08-02 LAB — WET PREP, GENITAL
Trich, Wet Prep: NONE SEEN
Yeast Wet Prep HPF POC: NONE SEEN

## 2014-08-02 LAB — URINALYSIS W MICROSCOPIC (NOT AT ARMC)
Bilirubin Urine: NEGATIVE
Glucose, UA: NEGATIVE mg/dL
HGB URINE DIPSTICK: NEGATIVE
KETONES UR: NEGATIVE mg/dL
NITRITE: NEGATIVE
Protein, ur: NEGATIVE mg/dL
Specific Gravity, Urine: 1.025 (ref 1.005–1.030)
UROBILINOGEN UA: 0.2 mg/dL (ref 0.0–1.0)
pH: 5 (ref 5.0–8.0)

## 2014-08-02 LAB — POC URINE PREG, ED: Preg Test, Ur: NEGATIVE

## 2014-08-02 LAB — PREGNANCY, URINE: PREG TEST UR: NEGATIVE

## 2014-08-02 MED ORDER — METRONIDAZOLE 500 MG PO TABS: 500.0000 mg | ORAL_TABLET | Freq: Two times a day (BID) | ORAL | Status: DC

## 2014-08-02 MED ORDER — CEPHALEXIN 500 MG PO CAPS: 500.0000 mg | ORAL_CAPSULE | Freq: Four times a day (QID) | ORAL | Status: DC

## 2014-08-02 NOTE — ED Notes (Signed)
Pelvic exam done.

## 2014-08-02 NOTE — Discharge Instructions (Signed)
Bacterial Vaginosis °Bacterial vaginosis is an infection of the vagina. It happens when too many of certain germs (bacteria) grow in the vagina. °HOME CARE °· Take your medicine as told by your doctor. °· Finish your medicine even if you start to feel better. °· Do not have sex until you finish your medicine and are better. °· Tell your sex partner that you have an infection. They should see their doctor for treatment. °· Practice safe sex. Use condoms. Have only one sex partner. °GET HELP IF: °· You are not getting better after 3 days of treatment. °· You have more grey fluid (discharge) coming from your vagina than before. °· You have more pain than before. °· You have a fever. °MAKE SURE YOU:  °· Understand these instructions. °· Will watch your condition. °· Will get help right away if you are not doing well or get worse. °Document Released: 11/26/2007 Document Revised: 12/07/2012 Document Reviewed: 09/28/2012 °ExitCare® Patient Information ©2015 [employer]. This information is not intended to replace advice given to you by your health care provider. Make sure you discuss any questions you have with your health care provider. ° °Urinary Tract Infection °A urinary tract infection (UTI) can occur any place along the urinary tract. The tract includes the kidneys, ureters, bladder, and urethra. A type of germ called bacteria often causes a UTI. UTIs are often helped with antibiotic medicine.  °HOME CARE  °· If given, take antibiotics as told by your doctor. Finish them even if you start to feel better. °· Drink enough fluids to keep your pee (urine) clear or pale yellow. °· Avoid tea, drinks with caffeine, and bubbly (carbonated) drinks. °· Pee often. Avoid holding your pee in for a long time. °· Pee before and after having sex (intercourse). °· Wipe from front to back after you poop (bowel movement) if you are a woman. Use each tissue only once. °GET HELP RIGHT AWAY IF:  °· You have back pain. °· You have lower  belly (abdominal) pain. °· You have chills. °· You feel sick to your stomach (nauseous). °· You throw up (vomit). °· Your burning or discomfort with peeing does not go away. °· You have a fever. °· Your symptoms are not better in 3 days. °MAKE SURE YOU:  °· Understand these instructions. °· Will watch your condition. °· Will get help right away if you are not doing well or get worse. °Document Released: 08/05/2007 Document Revised: 11/11/2011 Document Reviewed: 09/17/2011 °ExitCare® Patient Information ©2015 [employer]. This information is not intended to replace advice given to you by your health care provider. Make sure you discuss any questions you have with your health care provider. ° °

## 2014-08-02 NOTE — ED Notes (Signed)
Pt reports thought she had a UTI approx 1 month ago but didn't have the money to go to urgent care.  Pt says was burning with urination.  Pt now says back is hurting vaginal area sore, foul odor to urine, and vaginal discharge.

## 2014-08-02 NOTE — ED Notes (Signed)
Low back pain , foul odor to urine,  Vag d/c for 2 years.  Alert, NAD

## 2014-08-03 LAB — GC/CHLAMYDIA PROBE AMP (~~LOC~~) NOT AT ARMC
Chlamydia: NEGATIVE
Neisseria Gonorrhea: NEGATIVE

## 2014-08-04 LAB — URINE CULTURE

## 2014-08-04 NOTE — ED Provider Notes (Signed)
CSN: [id number]     Arrival date & time 08/02/14  1026 History   First MD Initiated Contact with Patient 08/02/14 1123     Chief Complaint  Patient presents with  . Back Pain  . Vaginal Discharge     (Consider location/radiation/quality/duration/timing/severity/associated sxs/prior Treatment) HPI  Jessica [name] is a 29 y.o. female who presents to the Emergency Department complaining of dysuria, burning with urination that began approximately one month ago, but states symptoms returned few days ago along with foul odor, vaginal discharge and irritation of her vagina.  She did not seek medical attention at onset of her symptoms.  symptoms are worse with urination.  She denies fever, vomiting, abdominal pain, vaginal bleeding or diarrhea. She has not taken any medications for her symptoms.   Past Medical History  Diagnosis Date  . Chronic hip pain   . Scoliosis    Past Surgical History  Procedure Laterality Date  . Cholecystectomy    . Tonsillectomy     Family History  Problem Relation Age of Onset  . Hypertension Mother    History  Substance Use Topics  . Smoking status: Current Every Day Smoker -- 0.50 packs/day    Types: Cigarettes  . Smokeless tobacco: Not on file  . Alcohol Use: Yes     Comment: rare   OB History    No data available     Review of Systems  Constitutional: Negative for fever, chills, activity change and appetite change.  Respiratory: Negative for chest tightness and shortness of breath.   Gastrointestinal: Negative for nausea, vomiting and abdominal pain.  Genitourinary: Positive for dysuria, urgency, frequency and vaginal discharge. Negative for hematuria, flank pain, decreased urine volume, vaginal bleeding, difficulty urinating and vaginal pain.  Musculoskeletal: Negative for back pain.  Skin: Negative for rash.  Neurological: Negative for dizziness, weakness and numbness.  Hematological: Negative for adenopathy.  Psychiatric/Behavioral:  Negative for confusion.  All other systems reviewed and are negative.     Allergies  Review of patient's allergies indicates no known allergies.  Home Medications   Prior to Admission medications   Medication Sig Start Date End Date Taking? Authorizing Provider  cephALEXin (KEFLEX) 500 MG capsule Take 1 capsule (500 mg total) by mouth 4 (four) times daily. For 7 days 08/02/14   Quita Mcgrory, PA-C  ibuprofen (ADVIL,MOTRIN) 200 MG tablet Take 600-800 mg by mouth every 6 (six) hours as needed for pain or headache. Pain    Historical Provider, MD  levonorgestrel (MIRENA) 20 MCG/24HR IUD 1 each by Intrauterine route once.      Historical Provider, MD  metroNIDAZOLE (FLAGYL) 500 MG tablet Take 1 tablet (500 mg total) by mouth 2 (two) times daily. For 10 days 08/02/14   Buren Havey, PA-C  naproxen sodium (ANAPROX) 220 MG tablet Take 660-1,100 mg by mouth daily as needed (hip pain).     Historical Provider, MD   BP 107/50 mmHg  Pulse 61  Temp(Src) 97.8 F (36.6 C) (Oral)  Resp 20  SpO2 100%  LMP 07/19/2014 Physical Exam  Constitutional: She is oriented to person, place, and time. She appears well-developed and well-nourished. No distress.  HENT:  Head: Normocephalic and atraumatic.  Cardiovascular: Normal rate, regular rhythm, normal heart sounds and intact distal pulses.   No murmur heard. Pulmonary/Chest: Effort normal and breath sounds normal. No respiratory distress. She has no wheezes. She has no rales.  Abdominal: Soft. Normal appearance. She exhibits no distension and no mass. There is no hepatosplenomegaly. There  is tenderness in the suprapubic area. There is no rigidity, no rebound, no guarding, no CVA tenderness and no tenderness at McBurney's point.  No CVA tenderness  Genitourinary: Uterus normal. There is no lesion on the right labia. There is no lesion on the left labia. Cervix exhibits discharge. Cervix exhibits no motion tenderness and no friability. Right adnexum displays  no mass and no tenderness. Left adnexum displays no mass and no tenderness.  No adnexal masses or tenderness. No CMT.  White thin , malodorous vaginal d/c  Musculoskeletal: Normal range of motion. She exhibits no edema.  Neurological: She is alert and oriented to person, place, and time. Coordination normal.  Skin: Skin is warm and dry. No rash noted.  Nursing note and vitals reviewed.   ED Course  Procedures (including critical care time) Labs Review Labs Reviewed  WET PREP, GENITAL - Abnormal; Notable for the following:    Clue Cells Wet Prep HPF POC MANY (*)    WBC, Wet Prep HPF POC FEW (*)    All other components within normal limits  URINALYSIS W MICROSCOPIC - Abnormal; Notable for the following:    Leukocytes, UA TRACE (*)    All other components within normal limits  URINE CULTURE  PREGNANCY, URINE  POC URINE PREG, ED  GC/CHLAMYDIA PROBE AMP ([place]) NOT AT [facility]    Imaging Review No results found.   EKG Interpretation None      MDM   Final diagnoses:  UTI (urinary tract infection), uncomplicated  Bacterial vaginosis    Pt is well appearing, vials stable.  No acute abdomen.  No concerning sx's for TOA.  Will treat for UTI and symptomatic BV.  Pt agrees to health dept for f/u or to return here if any worsening sx's.    Pauline Aus, PA-C 08/04/14 2226  Benjiman Core, MD 08/06/14 [phone]

## 2014-08-05 ENCOUNTER — Telehealth (HOSPITAL_COMMUNITY): Payer: Self-pay

## 2014-08-05 NOTE — Telephone Encounter (Signed)
Post ED Visit - Positive Culture Follow-up  Culture report reviewed by antimicrobial stewardship pharmacist: []  Wes Dulaney, Pharm.D., BCPS []  Celedonio Miyamoto, Pharm.D., BCPS []  Georgina Pillion, Pharm.D., BCPS []  [place], [place].D., BCPS, AAHIVP [x]  Estella Husk, Pharm.D., BCPS, AAHIVP []  Elder Cyphers, [address].D., BCPS  Positive Urine culture, >/= 100,000 colonies -> E Coli Treated with Cephalexin, organism sensitive to the same and no further patient follow-up is required at this time.  Arvid Right 08/05/2014, 7:44 PM

## 2014-08-10 ENCOUNTER — Emergency Department (HOSPITAL_COMMUNITY)
Admission: EM | Admit: 2014-08-10 | Discharge: 2014-08-10 | Disposition: A | Discharge status: Home / Self Care | Payer: Self-pay | Attending: Emergency Medicine | Admitting: Emergency Medicine

## 2014-08-10 ENCOUNTER — Encounter (HOSPITAL_COMMUNITY): Payer: Self-pay | Admitting: *Deleted

## 2014-08-10 DIAGNOSIS — M12552 Traumatic arthropathy, left hip: Secondary | ICD-10-CM | POA: Insufficient documentation

## 2014-08-10 DIAGNOSIS — M25552 Pain in left hip: Secondary | ICD-10-CM

## 2014-08-10 DIAGNOSIS — Z792 Long term (current) use of antibiotics: Secondary | ICD-10-CM | POA: Insufficient documentation

## 2014-08-10 DIAGNOSIS — M1652 Unilateral post-traumatic osteoarthritis, left hip: Secondary | ICD-10-CM

## 2014-08-10 DIAGNOSIS — G8929 Other chronic pain: Secondary | ICD-10-CM | POA: Insufficient documentation

## 2014-08-10 DIAGNOSIS — M419 Scoliosis, unspecified: Secondary | ICD-10-CM | POA: Insufficient documentation

## 2014-08-10 DIAGNOSIS — Z720 Tobacco use: Secondary | ICD-10-CM | POA: Insufficient documentation

## 2014-08-10 LAB — CBG MONITORING, ED: Glucose-Capillary: 82 mg/dL (ref 65–99)

## 2014-08-10 MED ORDER — MELOXICAM 7.5 MG PO TABS: 7.5000 mg | ORAL_TABLET | Freq: Every day | ORAL | Status: DC

## 2014-08-10 NOTE — ED Notes (Signed)
Pain lt hip, onset this am.  No injury. Hx of arthritis in this hip.  Pain from hip to lower leg.

## 2014-08-10 NOTE — Discharge Instructions (Signed)

## 2014-08-10 NOTE — ED Notes (Addendum)
cbg was checked per pt's request, says she is shakey when she has not eaten in a while and thinks she has hypoglycemial

## 2014-08-10 NOTE — ED Notes (Signed)
Pain lt hip , with radiation down lt knee.  No injury known
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
## 2014-12-31 ENCOUNTER — Emergency Department (HOSPITAL_COMMUNITY)
Admission: EM | Admit: 2014-12-31 | Discharge: 2014-12-31 | Disposition: A | Discharge status: Home / Self Care | Payer: Self-pay | Attending: Emergency Medicine | Admitting: Emergency Medicine

## 2014-12-31 ENCOUNTER — Emergency Department (HOSPITAL_COMMUNITY): Payer: Self-pay

## 2014-12-31 ENCOUNTER — Encounter (HOSPITAL_COMMUNITY): Payer: Self-pay | Admitting: Emergency Medicine

## 2014-12-31 DIAGNOSIS — M419 Scoliosis, unspecified: Secondary | ICD-10-CM | POA: Insufficient documentation

## 2014-12-31 DIAGNOSIS — M6281 Muscle weakness (generalized): Secondary | ICD-10-CM | POA: Insufficient documentation

## 2014-12-31 DIAGNOSIS — G8929 Other chronic pain: Secondary | ICD-10-CM | POA: Insufficient documentation

## 2014-12-31 DIAGNOSIS — M5412 Radiculopathy, cervical region: Secondary | ICD-10-CM | POA: Insufficient documentation

## 2014-12-31 DIAGNOSIS — Z8781 Personal history of (healed) traumatic fracture: Secondary | ICD-10-CM | POA: Insufficient documentation

## 2014-12-31 DIAGNOSIS — J069 Acute upper respiratory infection, unspecified: Secondary | ICD-10-CM | POA: Insufficient documentation

## 2014-12-31 DIAGNOSIS — Z791 Long term (current) use of non-steroidal anti-inflammatories (NSAID): Secondary | ICD-10-CM | POA: Insufficient documentation

## 2014-12-31 DIAGNOSIS — M79602 Pain in left arm: Secondary | ICD-10-CM | POA: Insufficient documentation

## 2014-12-31 DIAGNOSIS — Z720 Tobacco use: Secondary | ICD-10-CM | POA: Insufficient documentation

## 2014-12-31 DIAGNOSIS — H9202 Otalgia, left ear: Secondary | ICD-10-CM | POA: Insufficient documentation

## 2014-12-31 MED ORDER — DIAZEPAM 5 MG PO TABS
5.0000 mg | ORAL_TABLET | Freq: Once | ORAL | Status: AC
  Administered 2014-12-31: 5 mg via ORAL
  Filled 2014-12-31: qty 1

## 2014-12-31 MED ORDER — CYCLOBENZAPRINE HCL 10 MG PO TABS: 10.0000 mg | ORAL_TABLET | Freq: Three times a day (TID) | ORAL | Status: DC | PRN

## 2014-12-31 MED ORDER — PSEUDOEPHEDRINE HCL 60 MG PO TABS: 60.0000 mg | ORAL_TABLET | Freq: Four times a day (QID) | ORAL | Status: DC | PRN

## 2014-12-31 MED ORDER — IBUPROFEN 800 MG PO TABS: 800.0000 mg | ORAL_TABLET | Freq: Three times a day (TID) | ORAL | Status: DC

## 2014-12-31 MED ORDER — IBUPROFEN 800 MG PO TABS
800.0000 mg | ORAL_TABLET | Freq: Once | ORAL | Status: AC
  Administered 2014-12-31: 800 mg via ORAL
  Filled 2014-12-31: qty 1

## 2014-12-31 NOTE — Discharge Instructions (Signed)
Cervical Radiculopathy Cervical radiculopathy means that a nerve in the neck is pinched or bruised. This can cause pain or loss of feeling (numbness) that runs from your neck to your arm and fingers. HOME CARE Managing Pain  Take over-the-counter and prescription medicines only as told by your doctor.  If directed, put ice on the injured or painful area.  Put ice in a plastic bag.  Place a towel between your skin and the bag.  Leave the ice on for 20 minutes, 2-3 times per day.  If ice does not help, you can try using heat. Take a warm shower or warm bath, or use a heat pack as told by your doctor.  You may try a gentle neck and shoulder massage. Activity  Rest as needed. Follow instructions from your doctor about any activities to avoid.  Do exercises as told by your doctor or physical therapist. General Instructions   If you were given a soft collar, wear it as told by your doctor.  Use a flat pillow when you sleep.  Keep all follow-up visits as told by your doctor. This is important. GET HELP IF:  Your condition does not improve with treatment. GET HELP RIGHT AWAY IF:   Your pain gets worse and is not controlled with medicine.  You lose feeling or feel weak in your hand, arm, face, or leg.  You have a fever.  You have a stiff neck.  You cannot control when you poop or pee (have incontinence).  You have trouble with walking, balance, or talking.   This information is not intended to replace advice given to you by your health care provider. Make sure you discuss any questions you have with your health care provider.   Document Released: 02/05/2011 Document Revised: 11/07/2014 Document Reviewed: 04/12/2014 Elsevier Interactive Patient Education 2016 Elsevier Inc.  Upper Respiratory Infection, Adult Most upper respiratory infections (URIs) are caused by a virus. A URI affects the nose, throat, and upper air passages. The most common type of URI is often called  "the common cold." HOME CARE   Take medicines only as told by your doctor.  Gargle warm saltwater or take cough drops to comfort your throat as told by your doctor.  Use a warm mist humidifier or inhale steam from a shower to increase air moisture. This may make it easier to breathe.  Drink enough fluid to keep your pee (urine) clear or pale yellow.  Eat soups and other clear broths.  Have a healthy diet.  Rest as needed.  Go back to work when your fever is gone or your doctor says it is okay.  You may need to stay home longer to avoid giving your URI to others.  You can also wear a face mask and wash your hands often to prevent spread of the virus.  Use your inhaler more if you have asthma.  Do not use any tobacco products, including cigarettes, chewing tobacco, or electronic cigarettes. If you need help quitting, ask your doctor. GET HELP IF:  You are getting worse, not better.  Your symptoms are not helped by medicine.  You have chills.  You are getting more short of breath.  You have brown or red mucus.  You have yellow or brown discharge from your nose.  You have pain in your face, especially when you bend forward.  You have a fever.  You have puffy (swollen) neck glands.  You have pain while swallowing.  You have white areas in the  back of your throat. GET HELP RIGHT AWAY IF:   You have very bad or constant:  Headache.  Ear pain.  Pain in your forehead, behind your eyes, and over your cheekbones (sinus pain).  Chest pain.  You have long-lasting (chronic) lung disease and any of the following:  Wheezing.  Long-lasting cough.  Coughing up blood.  A change in your usual mucus.  You have a stiff neck.  You have changes in your:  Vision.  Hearing.  Thinking.  Mood. MAKE SURE YOU:   Understand these instructions.  Will watch your condition.  Will get help right away if you are not doing well or get worse.   This information is  not intended to replace advice given to you by your health care provider. Make sure you discuss any questions you have with your health care provider.   Document Released: 08/05/2007 Document Revised: 07/03/2014 Document Reviewed: 05/24/2013 Elsevier Interactive Patient Education [employer]

## 2014-12-31 NOTE — ED Notes (Signed)
pt says after triage that she had injury to her left arm when she was about 37 or 29 years old.

## 2014-12-31 NOTE — ED Notes (Signed)
Pt seen and evaluated by EDPa for initial assessment. 

## 2014-12-31 NOTE — ED Notes (Signed)
Having left arm pain since last Tuesday and pain to left ear started today.  Rates arm pain 7/10 and ear pain 5/10.  Took ibuprofen at 1530 for fever of  100.0.  Current temp 99.5.  Has child at home with fever this past week.

## 2014-12-31 NOTE — ED Provider Notes (Signed)
CSN: [id number]     Arrival date & time 12/31/14  1708 History  By signing my name below, I, Jessica [name], attest that this documentation has been prepared under the direction and in the presence of Tino Ronan, PA-C.  Electronically Signed: Gwenyth [name], ED Scribe. 12/31/2014. 7:14 PM.   Chief Complaint  Patient presents with  . Arm Pain    left  . Otalgia    left   Patient is a 29 y.o. female presenting with arm pain and ear pain. The history is provided by the patient. No language interpreter was used.  Arm Pain This is a new problem. The current episode started more than 1 week ago. The problem occurs constantly. The problem has been gradually worsening. Nothing relieves the symptoms. She has tried nothing for the symptoms.  Otalgia Associated symptoms: fever and neck pain    HPI Comments: Jessica [name] is a 29 y.o. female who presents to the Emergency Department complaining of constant, moderate, shooting and aching whole left arm pain, worst in her upper arm, that radiates to her neck and started 1 week ago. Her pain becomes worse with movement. Pt has a history of a left humerus fracture and states reccurent aching pain over area that occurs with weather changes. Her pain today is similar, but more severe than prior episodes. Pt notes baseline weakness of her left arm that is unchanged today. She denies any recent falls, injuries or heavy lifting, swelling, extremity numbness.  Pt also reports constant, moderate left ear pain that started today. Pt states fever and increased fatigue as associated symptoms. She tried Ibuprofen with no relief. Pt's daughter is sick at home with similar symptoms.   Past Medical History  Diagnosis Date  . Chronic hip pain   . Scoliosis    Past Surgical History  Procedure Laterality Date  . Cholecystectomy    . Tonsillectomy     Family History  Problem Relation Age of Onset  . Hypertension Mother    Social History  Substance Use  Topics  . Smoking status: Current Every Day Smoker -- 0.50 packs/day    Types: Cigarettes  . Smokeless tobacco: None  . Alcohol Use: Yes     Comment: rare   OB History    No data available     Review of Systems  Constitutional: Positive for fever and fatigue.  HENT: Positive for ear pain.   Musculoskeletal: Positive for arthralgias and neck pain.  Neurological: Positive for weakness.  All other systems reviewed and are negative.  Allergies  Review of patient's allergies indicates no known allergies.  Home Medications   Prior to Admission medications   Medication Sig Start Date End Date Taking? Authorizing Provider  cephALEXin (KEFLEX) 500 MG capsule Take 1 capsule (500 mg total) by mouth 4 (four) times daily. For 7 days Patient not taking: Reported on 08/10/2014 08/02/14   Tyese Finken, PA-C  ibuprofen (ADVIL,MOTRIN) 200 MG tablet Take 600-800 mg by mouth every 6 (six) hours as needed for pain or headache. Pain    Historical Provider, MD  levonorgestrel (MIRENA) 20 MCG/24HR IUD 1 each by Intrauterine route once.      Historical Provider, MD  meloxicam (MOBIC) 7.5 MG tablet Take 1 tablet (7.5 mg total) by mouth daily. 08/10/14   Burgess Amor, PA-C  metroNIDAZOLE (FLAGYL) 500 MG tablet Take 1 tablet (500 mg total) by mouth 2 (two) times daily. For 10 days Patient not taking: Reported on 08/10/2014 08/02/14   Pauline Aus, PA-C  BP 114/55 mmHg  Pulse 85  Temp(Src) 99.5 F (37.5 C) (Oral)  Resp 16  Ht  (1.727 m)  Wt 229 lb 4.8 oz (104.01 kg)  BMI 34.87 kg/m2  SpO2 100%  LMP 12/17/2014 Physical Exam  Constitutional: She is oriented to person, place, and time. She appears well-developed and well-nourished. No distress.  HENT:  Head: Normocephalic and atraumatic.  Left ear: Mild-to-moderate middle ear effusion No erythema or TM bulging  Eyes: Conjunctivae and EOM are normal.  Neck: Neck supple. No tracheal deviation present.  Cardiovascular: Normal rate, regular rhythm  and normal heart sounds.   Pulmonary/Chest: Effort normal and breath sounds normal. No respiratory distress. She has no wheezes.  Musculoskeletal: She exhibits no edema.  3/5 musculoskeletal weakness of LUE No erythema or edema Pain reproduced with abduction left arm, ROM preserved.  Lymphadenopathy:    She has no cervical adenopathy.  Neurological: She is alert and oriented to person, place, and time. Coordination normal.  Skin: Skin is warm and dry.  Psychiatric: She has a normal mood and affect. Her behavior is normal.  Nursing note and vitals reviewed.  ED Course  Procedures  DIAGNOSTIC STUDIES: Oxygen Saturation is 100% on RA, normal by my interpretation.    COORDINATION OF CARE: 7:14 PM Discussed treatment plan with pt which includes CT Cervical Spine without contrast. Pt agreed to plan.   Labs Review Labs Reviewed - No data to display  Imaging Review Ct Cervical Spine Wo Contrast  12/31/2014  CLINICAL DATA:  Neck pain and left arm weakness. EXAM: CT CERVICAL SPINE WITHOUT CONTRAST TECHNIQUE: Multidetector CT imaging of the cervical spine was performed without intravenous contrast. Multiplanar CT image reconstructions were also generated. COMPARISON:  Prior cervical spine radiographs on 05/09/2013 FINDINGS: The cervical spine shows normal alignment. There is no evidence of acute fracture or subluxation. No soft tissue swelling or hematoma is identified. There are no significant degenerative changes. The C4-5 level shows minimal posterior endplate osteophyte formation. This is not causing spinal stenosis or foraminal stenosis. No bony or soft tissue lesions are seen. The visualized airway is normally patent. IMPRESSION: No acute findings.  Minimal proliferative changes at C4-5. Electronically Signed   By: Irish Lack M.D.   On: 12/31/2014 19:44    .I have personally reviewed and evaluated these images as part of my medical decision-making.    MDM   Final diagnoses:  URI  (upper respiratory infection)  Cervical radiculopathy    Pt is well appearing. Non-toxic.  Ambulating in the dept w/o difficulty.  No focal neuro deficits on exam,   CT of c spine is reassuring, pain possibly related to osteophyte formation, but pt advised that she needs to arrange close ortho or PMD f/u.   She agrees to plan and return precautions given.  I personally performed the services described in this documentation, which was scribed in my presence. The recorded information has been reviewed and is accurate.    Pauline Aus, PA-C 01/03/15 2203  Glynn Octave, MD 01/04/15 1500

## 2016-04-29 DIAGNOSIS — Z6837 Body mass index (BMI) 37.0-37.9, adult: Secondary | ICD-10-CM | POA: Diagnosis not present

## 2016-04-29 DIAGNOSIS — Z1389 Encounter for screening for other disorder: Secondary | ICD-10-CM | POA: Diagnosis not present

## 2016-04-29 DIAGNOSIS — Z0000 Encounter for general adult medical examination without abnormal findings: Secondary | ICD-10-CM | POA: Diagnosis not present

## 2016-05-13 ENCOUNTER — Encounter: Payer: Self-pay | Admitting: Obstetrics & Gynecology

## 2016-05-18 ENCOUNTER — Encounter: Payer: Self-pay | Admitting: *Deleted

## 2016-05-26 ENCOUNTER — Encounter: Payer: Self-pay | Admitting: Adult Health

## 2016-05-26 ENCOUNTER — Ambulatory Visit (INDEPENDENT_AMBULATORY_CARE_PROVIDER_SITE_OTHER): Payer: 59 | Admitting: Adult Health

## 2016-05-26 ENCOUNTER — Other Ambulatory Visit (HOSPITAL_COMMUNITY)
Admission: RE | Admit: 2016-05-26 | Discharge: 2016-05-26 | Disposition: A | Discharge status: Home / Self Care | Payer: 59 | Source: Ambulatory Visit | Attending: Adult Health | Admitting: Adult Health

## 2016-05-26 VITALS — BP 100/60 | HR 66 | Ht 68.0 in | Wt 250.5 lb

## 2016-05-26 DIAGNOSIS — Z01419 Encounter for gynecological examination (general) (routine) without abnormal findings: Secondary | ICD-10-CM | POA: Insufficient documentation

## 2016-05-26 DIAGNOSIS — F32A Depression, unspecified: Secondary | ICD-10-CM

## 2016-05-26 DIAGNOSIS — N898 Other specified noninflammatory disorders of vagina: Secondary | ICD-10-CM

## 2016-05-26 DIAGNOSIS — Z113 Encounter for screening for infections with a predominantly sexual mode of transmission: Secondary | ICD-10-CM | POA: Diagnosis not present

## 2016-05-26 DIAGNOSIS — Z1151 Encounter for screening for human papillomavirus (HPV): Secondary | ICD-10-CM | POA: Diagnosis not present

## 2016-05-26 DIAGNOSIS — N760 Acute vaginitis: Secondary | ICD-10-CM

## 2016-05-26 DIAGNOSIS — Z975 Presence of (intrauterine) contraceptive device: Secondary | ICD-10-CM

## 2016-05-26 DIAGNOSIS — B9689 Other specified bacterial agents as the cause of diseases classified elsewhere: Secondary | ICD-10-CM

## 2016-05-26 DIAGNOSIS — F329 Major depressive disorder, single episode, unspecified: Secondary | ICD-10-CM

## 2016-05-26 LAB — POCT WET PREP (WET MOUNT)
CLUE CELLS WET PREP WHIFF POC: NEGATIVE
WBC, Wet Prep HPF POC: POSITIVE

## 2016-05-26 MED ORDER — ESCITALOPRAM OXALATE 10 MG PO TABS: 10.0000 mg | ORAL_TABLET | Freq: Every day | ORAL | 6 refills | Status: DC

## 2016-05-26 MED ORDER — METRONIDAZOLE 500 MG PO TABS: 500.0000 mg | ORAL_TABLET | Freq: Two times a day (BID) | ORAL | 0 refills | Status: DC

## 2016-05-26 NOTE — Progress Notes (Signed)
Patient ID: Jessica [name], female   DOB: 1985-08-30, 31 y.o.   MRN: [id number] History of Present Illness: Jessica [name] is a 31 year old white female, recently married 04/30/16, in for well woman gyn exam and pap.She has IUD that was inserted in 2014.She has 27 year old daughter and 41 year old step son.She works in Surveyor, mining at [employer].She is complaining of urinary frequency and vaginal discharge with odor.She has had labs at PCP.She has weighed over 350 lbs in the past but lost 100 lbs when divorced her first husband. PCP is Dr Sherwood Gambler at [facility].    Current Medications, Allergies, Past Medical History, Past Surgical History, Family History and Social History were reviewed in [employer] record.     Review of Systems: Patient denies any daily headaches(but has frequent ones) hearing loss, fatigue, blurred vision, shortness of breath, chest pain, abdominal pain, problems with bowel movements, or intercourse. No joint pain. + urinary frequency + vaginal discharge with odor + depressed    Physical Exam:BP 100/60 (BP Location: Left Arm, Patient Position: Sitting, Cuff Size: Large)   Pulse 66   Ht 5\' 8"  (1.727 m)   Wt 250 lb 8 oz (113.6 kg)   LMP 05/19/2016 (Approximate)   BMI 38.09 kg/m  General:  Well developed, well nourished, no acute distress Skin:  Warm and dry, has several tattoos Neck:  Midline trachea, normal thyroid, good ROM, no lymphadenopathy Lungs; Clear to auscultation bilaterally Breast:  No dominant palpable mass, retraction, or nipple discharge Cardiovascular: Regular rate and rhythm Abdomen:  Soft, non tender, no hepatosplenomegaly, has loose skin Pelvic:  External genitalia is normal in appearance, no lesions.  The vagina is normal in appearance, with white discharge, no odor, wet prep:+WBC and clue cells. Urethra has no lesions or masses. The cervix is bulbous. +IUD strins, pap with HPV and GC/CHL performed. Uterus is felt to be normal size, shape, and contour.   No adnexal masses or tenderness noted.Bladder is non tender, no masses felt. Extremities/musculoskeletal:  No swelling or varicosities noted, no clubbing or cyanosis Psych:  No mood changes, alert and cooperative,seems happy PHQ 9 score 15.Jessica [name] any suicidal or homicidal ideations, but is depressed and gets defensive and does not want to be around the kids.Discussed trying meds and she agrees and also discussed possible counseling.   Impression:  1. Encounter for gynecological examination with Papanicolaou smear of cervix   2. IUD (intrauterine device) in place   3. Vaginal discharge   4. Depression, unspecified depression type   5. BV (bacterial vaginosis)      Plan: Rx flagyl 500 mg 1 bid x 7 days, no alcohol, review handout on BV    No sex during treatment Rx lexapro 10 mg #30 take 1 daily with 6 refills Follow up in 4 weeks Physical in 1 year Pap in 3 if normal

## 2016-05-26 NOTE — Patient Instructions (Signed)
Bacterial Vaginosis Bacterial vaginosis is a vaginal infection that occurs when the normal balance of bacteria in the vagina is disrupted. It results from an overgrowth of certain bacteria. This is the most common vaginal infection among women ages 15-44. Because bacterial vaginosis increases your risk for STIs (sexually transmitted infections), getting treated can help reduce your risk for chlamydia, gonorrhea, herpes, and HIV (human immunodeficiency virus). Treatment is also important for preventing complications in pregnant women, because this condition can cause an early (premature) delivery. What are the causes? This condition is caused by an increase in harmful bacteria that are normally present in small amounts in the vagina. However, the reason that the condition develops is not fully understood. What increases the risk? The following factors may make you more likely to develop this condition:  Having a new sexual partner or multiple sexual partners.  Having unprotected sex.  Douching.  Having an intrauterine device (IUD).  Smoking.  Drug and alcohol abuse.  Taking certain antibiotic medicines.  Being pregnant. You cannot get bacterial vaginosis from toilet seats, bedding, swimming pools, or contact with objects around you. What are the signs or symptoms? Symptoms of this condition include:  Grey or white vaginal discharge. The discharge can also be watery or foamy.  A fish-like odor with discharge, especially after sexual intercourse or during menstruation.  Itching in and around the vagina.  Burning or pain with urination. Some women with bacterial vaginosis have no signs or symptoms. How is this diagnosed? This condition is diagnosed based on:  Your medical history.  A physical exam of the vagina.  Testing a sample of vaginal fluid under a microscope to look for a large amount of bad bacteria or abnormal cells. Your health care provider may use a cotton swab or a  small wooden spatula to collect the sample. How is this treated? This condition is treated with antibiotics. These may be given as a pill, a vaginal cream, or a medicine that is put into the vagina (suppository). If the condition comes back after treatment, a second round of antibiotics may be needed. Follow these instructions at home: Medicines   Take over-the-counter and prescription medicines only as told by your health care provider.  Take or use your antibiotic as told by your health care provider. Do not stop taking or using the antibiotic even if you start to feel better. General instructions   If you have a female sexual partner, tell her that you have a vaginal infection. She should see her health care provider and be treated if she has symptoms. If you have a female sexual partner, he does not need treatment.  During treatment:  Avoid sexual activity until you finish treatment.  Do not douche.  Avoid alcohol as directed by your health care provider.  Avoid breastfeeding as directed by your health care provider.  Drink enough water and fluids to keep your urine clear or pale yellow.  Keep the area around your vagina and rectum clean.  Wash the area daily with warm water.  Wipe yourself from front to back after using the toilet.  Keep all follow-up visits as told by your health care provider. This is important. How is this prevented?  Do not douche.  Wash the outside of your vagina with warm water only.  Use protection when having sex. This includes latex condoms and dental dams.  Limit how many sexual partners you have. To help prevent bacterial vaginosis, it is best to have sex with just one   partner (monogamous).  Make sure you and your sexual partner are tested for STIs.  Wear cotton or cotton-lined underwear.  Avoid wearing tight pants and pantyhose, especially during summer.  Limit the amount of alcohol that you drink.  Do not use any products that contain  nicotine or tobacco, such as cigarettes and e-cigarettes. If you need help quitting, ask your health care provider.  Do not use illegal drugs. Where to find more information:  Centers for Disease Control and Prevention: SolutionApps.co.za  American Sexual Health Association (ASHA): www.ashastd.org  U.S. Department of Health and Health and safety inspector, Office on Women's Health: ConventionalMedicines.si or http://www.anderson-williamson.info/ Contact a health care provider if:  Your symptoms do not improve, even after treatment.  You have more discharge or pain when urinating.  You have a fever.  You have pain in your abdomen.  You have pain during sex.  You have vaginal bleeding between periods. Summary  Bacterial vaginosis is a vaginal infection that occurs when the normal balance of bacteria in the vagina is disrupted.  Because bacterial vaginosis increases your risk for STIs (sexually transmitted infections), getting treated can help reduce your risk for chlamydia, gonorrhea, herpes, and HIV (human immunodeficiency virus). Treatment is also important for preventing complications in pregnant women, because the condition can cause an early (premature) delivery.  This condition is treated with antibiotic medicines. These may be given as a pill, a vaginal cream, or a medicine that is put into the vagina (suppository). This information is not intended to replace advice given to you by your health care provider. Make sure you discuss any questions you have with your health care provider. Document Released: 02/16/2005 Document Revised: 11/02/2015 Document Reviewed: 11/02/2015 Elsevier Interactive Patient Education  2017 [employer]. No sex or alcohol  Follow up 4 weeks

## 2016-05-28 LAB — CYTOLOGY - PAP
Chlamydia: NEGATIVE
Diagnosis: NEGATIVE
HPV: NOT DETECTED
Neisseria Gonorrhea: NEGATIVE

## 2016-06-23 ENCOUNTER — Ambulatory Visit (INDEPENDENT_AMBULATORY_CARE_PROVIDER_SITE_OTHER): Payer: 59 | Admitting: Adult Health

## 2016-06-23 ENCOUNTER — Encounter: Payer: Self-pay | Admitting: Adult Health

## 2016-06-23 VITALS — BP 106/70 | HR 68 | Ht 68.5 in | Wt 256.5 lb

## 2016-06-23 DIAGNOSIS — F329 Major depressive disorder, single episode, unspecified: Secondary | ICD-10-CM

## 2016-06-23 DIAGNOSIS — F32A Depression, unspecified: Secondary | ICD-10-CM

## 2016-06-23 MED ORDER — ESCITALOPRAM OXALATE 20 MG PO TABS: 20.0000 mg | ORAL_TABLET | Freq: Every day | ORAL | 6 refills | Status: DC

## 2016-06-23 NOTE — Progress Notes (Signed)
Subjective:     Patient ID: Jessica [name], female   DOB: [birth year], 31 y.o.   MRN: [id number]  HPI Jessica [name] is a 31 year old white female married, back in follow up of starting lexapro in March. She does not feel any better yet.She was treated for BV then too.   Review of Systems  Does not feel better yet Wants to eat all the time Has urinary frequency still, no burning or odor  Reviewed past medical,surgical, social and family history. Reviewed medications and allergies.     Objective:   Physical Exam BP 106/70 (BP Location: Left Arm, Patient Position: Sitting, Cuff Size: Large)   Pulse 68   Ht 5' 8.5" (1.74 m)   Wt 256 lb 8 oz (116.3 kg)   LMP 06/12/2016 (Approximate)   BMI 38.43 kg/m  Skin warm and dry. Lungs: clear to ausculation bilaterally. Cardiovascular: regular rate and rhythm.   PHQ 9 score 18, is on lexapro,denies any suicidal or homicidal ideations, will increase lexapro to 20 mg.  Assessment:     1. Depression, unspecified depression type       Plan:     Will increase lexapro to 20 mg,Rx lexapro 20 mg #30 take 1 daily with 6 refills Call me in about 2 weeks if not better will add buspar 5 mg bid to lexapro  Follow up in 3 months

## 2016-07-22 MED FILL — ESCITALOPRAM 20 MG TABLET: 20 | 30 days supply | Qty: 30 | Fill #0

## 2016-08-26 MED FILL — ESCITALOPRAM 20 MG TABLET: 20 | 30 days supply | Qty: 30 | Fill #1

## 2016-09-22 ENCOUNTER — Encounter: Payer: Self-pay | Admitting: Adult Health

## 2016-09-22 ENCOUNTER — Ambulatory Visit (INDEPENDENT_AMBULATORY_CARE_PROVIDER_SITE_OTHER): Payer: 59 | Admitting: Adult Health

## 2016-09-22 VITALS — BP 100/58 | HR 60 | Ht 68.5 in | Wt 255.0 lb

## 2016-09-22 DIAGNOSIS — F329 Major depressive disorder, single episode, unspecified: Secondary | ICD-10-CM

## 2016-09-22 DIAGNOSIS — F32A Depression, unspecified: Secondary | ICD-10-CM

## 2016-09-22 MED ORDER — ESCITALOPRAM OXALATE 20 MG PO TABS: 20.0000 mg | ORAL_TABLET | Freq: Every day | ORAL | 10 refills | Status: DC

## 2016-09-22 MED FILL — ESCITALOPRAM 20 MG TABLET: 20 | 30 days supply | Qty: 30 | Fill #0

## 2016-09-22 NOTE — Progress Notes (Signed)
Subjective:     Patient ID: Jessica [name], female   DOB: 03/18/1985, 31 y.o.   MRN: [id number]  HPI Jessica [name] is a 31 year old white female, back in follow up on taking lexapro 20 mg and is doing well.   Review of Systems Not as depressed, doing well Reviewed past medical,surgical, social and family history. Reviewed medications and allergies.     Objective:   Physical Exam BP (!) 100/58 (BP Location: Left Arm, Patient Position: Sitting, Cuff Size: Large)   Pulse 60   Ht 5' 8.5" (1.74 m)   Wt 255 lb (115.7 kg)   LMP 09/20/2016   BMI 38.21 kg/m  Talk only. PHQ 2 score 0, PHQ 9 score was 15 in march and 18 in April, increased lexapro to 20 mg then and she is doing well.Will continue 20 mg lexapro.     Assessment:     1. Depression, unspecified depression type       Plan:     Rx lexapro 20 mg #30 take 1 daily with 10 refills Return in about 9 months for physical or before if needed

## 2016-10-28 MED FILL — ESCITALOPRAM 20 MG TABLET: 20 | 30 days supply | Qty: 30 | Fill #1

## 2016-11-30 MED FILL — ESCITALOPRAM 20 MG TABLET: 20 | 30 days supply | Qty: 30 | Fill #2

## 2017-02-03 DIAGNOSIS — Z1389 Encounter for screening for other disorder: Secondary | ICD-10-CM | POA: Diagnosis not present

## 2017-02-03 DIAGNOSIS — Z6841 Body Mass Index (BMI) 40.0 and over, adult: Secondary | ICD-10-CM | POA: Diagnosis not present

## 2017-02-03 DIAGNOSIS — J019 Acute sinusitis, unspecified: Secondary | ICD-10-CM | POA: Diagnosis not present

## 2017-02-03 DIAGNOSIS — H6983 Other specified disorders of Eustachian tube, bilateral: Secondary | ICD-10-CM | POA: Diagnosis not present

## 2017-02-03 MED FILL — AMOX-CLAV 875-125 MG TABLET: 875-125 | 10 days supply | Qty: 20 | Fill #0

## 2017-04-20 DIAGNOSIS — Z6841 Body Mass Index (BMI) 40.0 and over, adult: Secondary | ICD-10-CM | POA: Diagnosis not present

## 2017-04-20 DIAGNOSIS — R432 Parageusia: Secondary | ICD-10-CM | POA: Diagnosis not present

## 2017-04-20 DIAGNOSIS — Z1389 Encounter for screening for other disorder: Secondary | ICD-10-CM | POA: Diagnosis not present

## 2017-04-20 DIAGNOSIS — R200 Anesthesia of skin: Secondary | ICD-10-CM | POA: Diagnosis not present

## 2017-04-20 DIAGNOSIS — R2689 Other abnormalities of gait and mobility: Secondary | ICD-10-CM | POA: Diagnosis not present

## 2017-04-21 MED FILL — ACYCLOVIR 400 MG TABLET: 400 | 10 days supply | Qty: 50 | Fill #0

## 2017-04-21 MED FILL — predniSONE 20 MG TABS: 20 | 5 days supply | Qty: 15 | Fill #0

## 2017-04-22 NOTE — Progress Notes (Signed)
[facility]    Provider:  Dr Lucia Gaskins Referring Provider: Elfredia Nevins, MD Primary Care Physician:  Elfredia Nevins, MD  CC:  The left side of the face numb  HPI:  Jessica [name] is a 32 y.o. female here as a referral from Dr. Sherwood Gambler for facial numbness.  Has past medical history of depression, chronic hip pain, UTI, numbness and tingling in neurosensory deficits (ED notes going as far back as 2015 for this problem), bilateral leg weakness (at least 4 years), neurosensory deficit (at least 4 years), morbid obesity. The left side of her face feels numb. Started 9 days ago initially tongue felt scalded, the next day her tongue and lips were numb and spread to the left side of her face. It is numb around her ear, feels this affects her balance, feels like pill gets stuck in her throat. No weakness in the face but feels tight. She has a hx of bacterial meningitis as an infant. Now her speech is affected. No trauma, new medications. No headaches, she had migraines in HS and every once in a while but this is not diagnostic of her migraines. Husband provides information, she appears it hard to swallow food. No drooling but she felt like she was. She feels the right side is numb as well of the lips but really the left side. No other numbness or paresthesias, no weakness, no neck stiffness or meningismus, no fevers. A few days before symptoms left side of the neck started hurting, pain and stiffness in her neck but this is not new and has occurred several times in the last few years. Tongue still feels scalded, she has lost taste.   Reviewed notes, labs and imaging from outside physicians, which showed:  Reviewed referring physician notes.  Patient has numbness in the mouth and left face, no pain, constant, decreased sensation, no weakness, getting worse, no recent illnesses, started February 14 when patient's tongue felt as if she had a skull of the tongue.  She has lost the ability to  taste food except for salt at the back of her tongue.  She is very frustrated that she is unable to taste food she works as a Personnel officer at [employer] and enjoys cooking for her family.  She is also biting her tongue frequently as she is unable to feel it.  The numbness has spread from her left lips to her cheek and chin and she also noticed difficulty with balance when she stands for long.Marland Kitchen  Neurologic exam showed left-sided numbness V2 V3 otherwise normal.  She was treated with acyclovir and prednisone.     Reviewed notes in our epic system, patient has been seen in the ED a plethora of times.Patient was seen by neurology in 2015.  She had decreased sensation in the bilateral lower extremities from the feet to the hip.  Then it moved to her mid chest circumferentially.  When up to the right hand and then drove herself to the emergency room.  Her description of decreased sensation was that she could not feel pressure but can feel the pinprick denied any falls, decreased strength, vision abnormalities, speech difficulty.  Complete thorough neurologic exam was normal.  No bowel or bladder issues.  MRI of the thoracic and lumbar spine with and without contrast were ordered.  reviewewd report below, MRI T/L spine 04/2013 IMPRESSION 1. Normal appearance of the spinal cord from the cervical thoracic junction through the conus medullaris at L1-2. 2. Scoliosis of the upper thoracic  spine. 3. No focal enhancement or lesion to explain the patient's symptoms. 4. Focal distortion of the spinal cord at T2-3 on the axial images is likely artifactual. This could be confirmed with MRI of the cervical spine if indicated. 5. Portions of the thoracic study are significantly degraded by patient motion. 6. T1 signal abnormality is more apparent on the thoracic and lumbar spine images. No focal lesions are evident. The patient is not anemic. This may be related to obesity.  Review of Systems: Patient complains of  symptoms per HPI as well as the following symptoms: numbness. Pertinent negatives and positives per HPI. All others negative.   Social History   Socioeconomic History  . Marital status: Single    Spouse name: Not on file  . Number of children: Not on file  . Years of education: Not on file  . Highest education level: Not on file  Social Needs  . Financial resource strain: Not on file  . Food insecurity - worry: Not on file  . Food insecurity - inability: Not on file  . Transportation needs - medical: Not on file  . Transportation needs - non-medical: Not on file  Occupational History  . Not on file  Tobacco Use  . Smoking status: Current Every Day Smoker    Packs/day: 1.00    Years: 13.00    Pack years: 13.00    Types: Cigarettes  . Smokeless tobacco: Never Used  Substance and Sexual Activity  . Alcohol use: Yes    Comment: occasional  . Drug use: Yes    Types: Marijuana    Comment: occ  . Sexual activity: Yes    Birth control/protection: IUD  Other Topics Concern  . Not on file  Social History Narrative  . Not on file    Family History  Problem Relation Age of Onset  . Hypertension Mother   . Diabetes Mother        prediabetic  . COPD Mother   . Diabetes Paternal Grandfather   . Hypertension Maternal Grandmother   . Cancer Maternal Grandfather     Past Medical History:  Diagnosis Date  . Chronic hip pain   . Closed left arm fracture    age [age]  . Scoliosis     Past Surgical History:  Procedure Laterality Date  . adnoids    . CHOLECYSTECTOMY    . NASAL SEPTUM SURGERY    . toe nails removed    . TONSILLECTOMY      Current Outpatient Medications  Medication Sig Dispense Refill  . acyclovir (ZOVIRAX) 400 MG tablet Take 400 mg by mouth 5 (five) times daily.    . cetirizine (ZYRTEC) 10 MG tablet Take 10 mg by mouth daily.    Marland Kitchen ibuprofen (ADVIL,MOTRIN) 200 MG tablet Take 400 mg by mouth daily as needed.     Marland Kitchen levonorgestrel (MIRENA) 20 MCG/24HR IUD  1 each by Intrauterine route once.      . predniSONE (DELTASONE) 20 MG tablet Take 20 mg by mouth daily with breakfast.     No current facility-administered medications for this visit.     Allergies as of 04/23/2017  . (No Known Allergies)    Vitals: BP 116/68   Pulse 64   Ht 5\' 8"  (1.727 m)   Wt 270 lb (122.5 kg)   BMI 41.05 kg/m  Last Weight:  Wt Readings from Last 1 Encounters:  04/23/17 270 lb (122.5 kg)   Last Height:   Ht Readings from Last  1 Encounters:  04/23/17 5\' 8"  (1.727 m)   Physical exam: Exam: Gen: NAD, conversant, well nourised, obese, well groomed                     CV: RRR, no MRG. No Carotid Bruits. No peripheral edema, warm, nontender Eyes: Conjunctivae clear without exudates or hemorrhage  Neuro: Detailed Neurologic Exam  Speech:    Speech is normal; fluent and spontaneous with normal comprehension.  Cognition:    The patient is oriented to person, place, and time;     recent and remote memory intact;     language fluent;     normal attention, concentration,     fund of knowledge Cranial Nerves:    The pupils are equal, round, and reactive to light. The fundi are normal and spontaneous venous pulsations are present. Visual fields are full to finger confrontation. Extraocular movements are intact. Left facial numbness. Left ptosis, left eye closure weakness. The palate elevates in the midline. Hearing intact. Voice is normal. Shoulder shrug is normal. The tongue has normal motion without fasciculations.   Coordination:    Normal finger to nose and heel to shin. Normal rapid alternating movements.   Gait:    Heel-toe and tandem gait are normal.   Motor Observation:    No asymmetry, no atrophy, and no involuntary movements noted. Tone:    Normal muscle tone.    Posture:    Posture is normal. normal erect    Strength:    Strength is V/V in the upper and lower limbs.      Sensation: decreased left side of face but intact in the limbs       Reflex Exam:  DTR's:    Deep tendon reflexes in the upper and lower extremities are brisk bilaterally.   Toes:    The toes are equiv bilaterally.   Clonus:    Clonus is absent.       Assessment/Plan:  Patient with left facial numbness and weakness. She has had other neurologic symptoms in the past. Very suspicious for Multiple Sclerosis. Will order MRI brain. Patient is worried about finances, will start with the brain and a few labs and then order more workup if needed.    Orders Placed This Encounter  Procedures  . MR BRAIN W WO CONTRAST  . Comprehensive metabolic panel  . CBC  . TSH  . ANA w/Reflex    Cc: Elfredia Nevins, MD  Naomie Dean, MD  [facility] [address] [place], [place] [address]  Phone [phone] Fax [phone]

## 2017-04-23 ENCOUNTER — Encounter: Payer: Self-pay | Admitting: Neurology

## 2017-04-23 ENCOUNTER — Ambulatory Visit (INDEPENDENT_AMBULATORY_CARE_PROVIDER_SITE_OTHER): Payer: 59 | Admitting: Neurology

## 2017-04-23 VITALS — BP 116/68 | HR 64 | Ht 68.0 in | Wt 270.0 lb

## 2017-04-23 DIAGNOSIS — R200 Anesthesia of skin: Secondary | ICD-10-CM

## 2017-04-23 DIAGNOSIS — H02402 Unspecified ptosis of left eyelid: Secondary | ICD-10-CM | POA: Diagnosis not present

## 2017-04-23 DIAGNOSIS — R29810 Facial weakness: Secondary | ICD-10-CM

## 2017-04-23 NOTE — Patient Instructions (Signed)
MRI brain

## 2017-04-25 LAB — COMPREHENSIVE METABOLIC PANEL
A/G RATIO: 1.6 (ref 1.2–2.2)
ALT: 16 IU/L (ref 0–32)
AST: 10 IU/L (ref 0–40)
Albumin: 4.1 g/dL (ref 3.5–5.5)
Alkaline Phosphatase: 49 IU/L (ref 39–117)
BUN/Creatinine Ratio: 19 (ref 9–23)
BUN: 15 mg/dL (ref 6–20)
Bilirubin Total: 0.4 mg/dL (ref 0.0–1.2)
CALCIUM: 9.5 mg/dL (ref 8.7–10.2)
CO2: 21 mmol/L (ref 20–29)
Chloride: 106 mmol/L (ref 96–106)
Creatinine, Ser: 0.8 mg/dL (ref 0.57–1.00)
GFR calc Af Amer: 114 mL/min/{1.73_m2} (ref >59)
GFR, EST NON AFRICAN AMERICAN: 99 mL/min/{1.73_m2} (ref >59)
GLOBULIN, TOTAL: 2.5 g/dL (ref 1.5–4.5)
Glucose: 73 mg/dL (ref 65–99)
POTASSIUM: 4.7 mmol/L (ref 3.5–5.2)
Sodium: 144 mmol/L (ref 134–144)
Total Protein: 6.6 g/dL (ref 6.0–8.5)

## 2017-04-25 LAB — ANA W/REFLEX: Anti Nuclear Antibody(ANA): NEGATIVE

## 2017-04-25 LAB — CBC
HEMOGLOBIN: 13.3 g/dL (ref 11.1–15.9)
Hematocrit: 40.2 % (ref 34.0–46.6)
MCH: 30.6 pg (ref 26.6–33.0)
MCHC: 33.1 g/dL (ref 31.5–35.7)
MCV: 93 fL (ref 79–97)
PLATELETS: 268 10*3/uL (ref 150–379)
RBC: 4.34 x10E6/uL (ref 3.77–5.28)
RDW: 13.6 % (ref 12.3–15.4)
WBC: 9.7 10*3/uL (ref 3.4–10.8)

## 2017-04-25 LAB — TSH: TSH: 1.93 u[IU]/mL (ref 0.450–4.500)

## 2017-04-26 ENCOUNTER — Telehealth: Payer: Self-pay | Admitting: *Deleted

## 2017-04-26 MED FILL — predniSONE 10 MG TABS: 10 | 6 days supply | Qty: 21 | Fill #0

## 2017-04-26 NOTE — Telephone Encounter (Signed)
Called patient and informed her that her labs are normal. Pt verbalized appreciation.

## 2017-04-26 NOTE — Telephone Encounter (Signed)
-----   Message from Anson Fret, MD sent at 04/25/2017  9:16 AM EST ----- Labs normal

## 2017-04-28 ENCOUNTER — Ambulatory Visit: Payer: 59

## 2017-04-28 DIAGNOSIS — H02402 Unspecified ptosis of left eyelid: Secondary | ICD-10-CM | POA: Diagnosis not present

## 2017-04-28 DIAGNOSIS — R200 Anesthesia of skin: Secondary | ICD-10-CM | POA: Diagnosis not present

## 2017-04-28 DIAGNOSIS — R29810 Facial weakness: Secondary | ICD-10-CM | POA: Diagnosis not present

## 2017-04-28 MED ORDER — GADOPENTETATE DIMEGLUMINE 469.01 MG/ML IV SOLN: 20.0000 mL | Freq: Once | INTRAVENOUS | Status: AC | PRN

## 2017-04-30 ENCOUNTER — Telehealth: Payer: Self-pay | Admitting: Neurology

## 2017-04-30 NOTE — Telephone Encounter (Signed)
RAS has seen MRI and I have spoken with pt. and given appt. with RAS 05/04/17, 9am, arrival time 0830/fim

## 2017-04-30 NOTE — Telephone Encounter (Signed)
Dr. Epimenio Foot: This is a new MS patient. Will you see patient please next week?   Abnormal MRI scan of the brain showing multiple periventricular, subcortical, corpus callosal and left middle cerebral   peduncle white matter hyperintensities compatible with demyelinating disease. No enhancing lesions are noted. Low-lying cerebellar tonsils with crowding of the craniovertebral junction indicative of type I Arnold-Chiari malformation

## 2017-05-04 ENCOUNTER — Encounter: Payer: Self-pay | Admitting: Neurology

## 2017-05-04 ENCOUNTER — Ambulatory Visit (INDEPENDENT_AMBULATORY_CARE_PROVIDER_SITE_OTHER): Payer: 59 | Admitting: Neurology

## 2017-05-04 ENCOUNTER — Encounter: Payer: Self-pay | Admitting: *Deleted

## 2017-05-04 ENCOUNTER — Other Ambulatory Visit: Payer: Self-pay

## 2017-05-04 DIAGNOSIS — G373 Acute transverse myelitis in demyelinating disease of central nervous system: Secondary | ICD-10-CM | POA: Diagnosis not present

## 2017-05-04 DIAGNOSIS — R270 Ataxia, unspecified: Secondary | ICD-10-CM | POA: Diagnosis not present

## 2017-05-04 DIAGNOSIS — G35 Multiple sclerosis: Secondary | ICD-10-CM | POA: Insufficient documentation

## 2017-05-04 DIAGNOSIS — R3915 Urgency of urination: Secondary | ICD-10-CM

## 2017-05-04 DIAGNOSIS — R200 Anesthesia of skin: Secondary | ICD-10-CM | POA: Diagnosis not present

## 2017-05-04 DIAGNOSIS — G35A Relapsing-remitting multiple sclerosis: Secondary | ICD-10-CM | POA: Insufficient documentation

## 2017-05-04 MED ORDER — OXYBUTYNIN CHLORIDE 5 MG PO TABS: 5.0000 mg | ORAL_TABLET | Freq: Two times a day (BID) | ORAL | 3 refills | Status: DC

## 2017-05-04 MED FILL — OXYBUTYNIN 5 MG TABLET: 5 | 90 days supply | Qty: 180 | Fill #0

## 2017-05-04 NOTE — Addendum Note (Signed)
Addended by: Despina Arias A on: 05/04/2017 10:31 AM   Modules accepted: Level of Service

## 2017-05-04 NOTE — Progress Notes (Addendum)
[facility]  PATIENT: Jessica [name] DOB: [birth year]  REFERRING DOCTOR OR PCP:  Dr. Lucia Gaskins.   PCP is Social research officer, government SOURCE: Patient, notes from Dr. Lucia Gaskins, imaging and lab results, MRI images on PACS personally reviewed.  _________________________________   HISTORICAL  CHIEF COMPLAINT:  Chief Complaint  Patient presents with  . Abnormal MRI Brain    Onset 3 wks. ago of feeling like her tongue had been burned, and left sided facial numbness.  5 yrs. ago had an episode of numbness from the waist down and Lhermitte's sign. (electric sensation running down her back if she bent her head down.)  Today had one episode of bowel incontinence.  Urinary frequency and excessive thirst, but sts. has been told she is not diabetic  No relief of sx. with oral steroids.Chucky May  . Numbness    HISTORY OF PRESENT ILLNESS:  I had the pleasure seeing your patient, Jessica [name], at the MS center at [facility] for neurologic consultation regarding her probable multiple sclerosis.  In March 2015, she had an episode of bilateral leg numbness and right arm numbness and MRI was recommended but she signed out against medical device from the emergency room.  She was numb from the waist down and had a Lhermitte sign.    This lasted 2 months and completely resolved.   She did not follow-up with neurology.   In mid February 2019, she had the onset of left facial dysesthesia followed by numbness including the tongue she also had some trouble swallowing. She had a scalded sensation in the tongue.    She feels she is about the same.   She feels slightly off with walking but balance is not bad.   She is able to climb stairs without problems.     Currently, gait, strength, sensation (outside face) ok.   She has urinary frequency x several months and she has some leakage.   She had one episode of bowel incontinence.  She has fatigue, physical > cognitive.   Sometimes she  feels sleepy..   She sleeps well at night.   She had depression in the past and was on a medication but stopped and feels better now.     She has more trouble staying focused than she used to.     I personally reviewed the MRI of the brain dated 04/28/2017. It is abnormal showing multiple T2/flair hyperintense foci in the periventricular, juxtacortical and deep white matter of both hemispheres, with many of the periventricular foci are radially oriented to the ventricles. She also had a focus in the left middle cerebellar peduncle. None of the foci enhanced.    On the thoracic spine images from 3 years ago, there is a possible T2 level cord focus but study is degraded by movement and interpretation is fairly difficult.    Lab work shows a negative ANA, TSH, CBC and CMP.    REVIEW OF SYSTEMS: Constitutional: No fevers, chills, sweats, or change in appetite Eyes: No visual changes, double vision, eye pain Ear, nose and throat: No hearing loss, ear pain, nasal congestion, sore throat Cardiovascular: No chest pain, palpitations Respiratory: No shortness of breath at rest or with exertion.   No wheezes GastrointestinaI: No nausea, vomiting, diarrhea, abdominal pain, fecal incontinence Genitourinary: No dysuria, urinary retention or frequency.  No nocturia. Musculoskeletal: No neck pain, back pain Integumentary: No rash, pruritus, skin lesions Neurological: as above Psychiatric: No depression at this time.  No anxiety Endocrine: No palpitations, diaphoresis,  change in appetite, change in weigh or increased thirst Hematologic/Lymphatic: No anemia, purpura, petechiae. Allergic/Immunologic: No itchy/runny eyes, nasal congestion, recent allergic reactions, rashes  ALLERGIES: No Known Allergies  HOME MEDICATIONS:  Current Outpatient Medications:  .  cetirizine (ZYRTEC) 10 MG tablet, Take 10 mg by mouth daily., Disp: , Rfl:  .  ibuprofen (ADVIL,MOTRIN) 200 MG tablet, Take 400 mg by mouth daily  as needed. , Disp: , Rfl:  .  levonorgestrel (MIRENA) 20 MCG/24HR IUD, 1 each by Intrauterine route once.  , Disp: , Rfl:  .  acyclovir (ZOVIRAX) 400 MG tablet, Take 400 mg by mouth 5 (five) times daily., Disp: , Rfl:  .  oxybutynin (DITROPAN) 5 MG tablet, Take 1 tablet (5 mg total) by mouth 2 (two) times daily., Disp: 180 tablet, Rfl: 3 .  predniSONE (DELTASONE) 20 MG tablet, Take 20 mg by mouth daily with breakfast., Disp: , Rfl:  No current facility-administered medications for this visit.   Facility-Administered Medications Ordered in Other Visits:  .  gadopentetate dimeglumine (MAGNEVIST) injection 20 mL, 20 mL, Intravenous, Once PRN, Anson Fret, MD  PAST MEDICAL HISTORY: Past Medical History:  Diagnosis Date  . Chronic hip pain   . Closed left arm fracture    age [age]  . Scoliosis     PAST SURGICAL HISTORY: Past Surgical History:  Procedure Laterality Date  . adnoids    . CHOLECYSTECTOMY    . NASAL SEPTUM SURGERY    . toe nails removed    . TONSILLECTOMY      FAMILY HISTORY: Family History  Problem Relation Age of Onset  . Hypertension Mother   . Diabetes Mother        prediabetic  . COPD Mother   . Diabetes Paternal Grandfather   . Hypertension Maternal Grandmother   . Other Maternal Grandmother   . Cancer Maternal Grandfather     SOCIAL HISTORY:  Social History   Socioeconomic History  . Marital status: Single    Spouse name: Not on file  . Number of children: Not on file  . Years of education: Not on file  . Highest education level: Not on file  Social Needs  . Financial resource strain: Not on file  . Food insecurity - worry: Not on file  . Food insecurity - inability: Not on file  . Transportation needs - medical: Not on file  . Transportation needs - non-medical: Not on file  Occupational History  . Not on file  Tobacco Use  . Smoking status: Current Every Day Smoker    Packs/day: 1.00    Years: 13.00    Pack years: 13.00    Types:  Cigarettes  . Smokeless tobacco: Never Used  Substance and Sexual Activity  . Alcohol use: Yes    Comment: occasional  . Drug use: Yes    Types: Marijuana    Comment: occ  . Sexual activity: Yes    Birth control/protection: IUD  Other Topics Concern  . Not on file  Social History Narrative  . Not on file     PHYSICAL EXAM  Vitals:   05/04/17 0847  BP: (!) 118/56  Pulse: 70  Resp: 16  Weight: 273 lb (123.8 kg)  Height: 5\' 8"  (1.727 m)    Body mass index is 41.51 kg/m.   General: The patient is well-developed and well-nourished and in no acute distress  Eyes:  Funduscopic exam shows normal optic discs and retinal vessels.  Neck: The neck is supple, no  carotid bruits are noted.  The neck is nontender.  Cardiovascular: The heart has a regular rate and rhythm with a normal S1 and S2. There were no murmurs, gallops or rubs. Lungs are clear to auscultation.  Skin: Extremities are without significant edema.  Musculoskeletal:  Back is nontender  Neurologic Exam  Mental status: The patient is alert and oriented x 3 at the time of the examination. The patient has apparent normal recent and remote memory, with an apparently normal attention span and concentration ability.   Speech is normal.  Cranial nerves: Extraocular movements are full. Pupils are equal, round, and reactive to light and accomodation.  Visual fields are full.  Facial symmetry is present.  She has reduced sensation to touch in the V2 and V3 distribution on the left. However, facial sensation was more symmetric to temperature.  Facial strength is normal.  Trapezius and sternocleidomastoid strength is normal. No dysarthria is noted.  The tongue is midline, and the patient has symmetric elevation of the soft palate. No obvious hearing deficits are noted.  Motor:  Muscle bulk is normal.   Tone is normal. Strength is  5 / 5 in all 4 extremities. Rapid alternating movements are performed normally  Sensory: Sensory  testing is intact to pinprick, soft touch and vibration sensation in all 4 extremities.  Coordination: Cerebellar testing reveals good finger-nose-finger and heel-to-shin on the right but slightly reduced on the left  Gait and station: Station is normal.   Gait is normal. Tandem gait is normal. Romberg is negative.   Reflexes: Deep tendon reflexes are symmetric and normal bilaterally in the arms and increase at the knees with spread    There is no ankle clonus.    DIAGNOSTIC DATA (LABS, IMAGING, TESTING) - I reviewed patient records, labs, notes, testing and imaging myself where available.  Lab Results  Component Value Date   WBC 9.7 04/23/2017   HGB 13.3 04/23/2017   HCT 40.2 04/23/2017   MCV 93 04/23/2017   PLT 268 04/23/2017      Component Value Date/Time   NA 144 04/23/2017 0951   K 4.7 04/23/2017 0951   CL 106 04/23/2017 0951   CO2 21 04/23/2017 0951   GLUCOSE 73 04/23/2017 0951   GLUCOSE 94 03/21/2014 0628   BUN 15 04/23/2017 0951   CREATININE 0.80 04/23/2017 0951   CALCIUM 9.5 04/23/2017 0951   PROT 6.6 04/23/2017 0951   ALBUMIN 4.1 04/23/2017 0951   AST 10 04/23/2017 0951   ALT 16 04/23/2017 0951   ALKPHOS 49 04/23/2017 0951   BILITOT 0.4 04/23/2017 0951   GFRNONAA 99 04/23/2017 0951   GFRAA 114 04/23/2017 0951   Lab Results  Component Value Date   CHOL 166 10/29/2009   HDL 37 (L) 10/29/2009   LDLCALC 103 (H) 10/29/2009   TRIG 129 10/29/2009   CHOLHDL 4.5 Ratio 10/29/2009   No results found for: HGBA1C No results found for: VITAMINB12 Lab Results  Component Value Date   TSH 1.930 04/23/2017       ASSESSMENT AND PLAN  Multiple sclerosis (HCC)  Facial numbness  Transverse myelitis (HCC)  Ataxia  Urinary urgency   1.      She has clinically definite MS by the McDonald criteria with 2 clinical events and evidence of 2 events objectively on physical examination and with an MRI consistent with MS showing periventricular, juxtacortical and  infratentorial lesions. Her first exacerbation 3 years ago was likely a transverse myelitis that was not covered on  the MRI (either because it  was missed due to movement artifact or was in the cervical spine). 2.      We had a long discussion about disease modifying therapies going over the pros and cons of multiple treatments. She does not feel that she would be compliant with self injectable medicine and is most interested in an oral agent. We discussed Aubagio, Tecfidera and Gilenya in more detail and she was most interested in [place]. She signed the service request form and we will complete the form and send it in. 3.     For her urinary urgency I will start oxybutynin as needed 4.     She will return to see me in 3-4 months and we will recheck an CBC with differential at that time. Later this year I will want to check an MRI of the brain and cervical spine. 5.      She should call us back sooner if she has any new or worsening neurologic symptoms. We discussed the symptoms and signs of possible exacerbations.      45 minute face-to-face evaluation with greater than one half of the time counseling and coordinating care about her new diagnosis of MS and treatment.   Randale Carvalho A. Epimenio Foot, MD, [facility] 05/04/2017, 10:30 AM Certified in Neurology, Clinical Neurophysiology, Sleep Medicine, Pain Medicine and Neuroimaging  [facility] [address], [address] [place], [place] [address] [phone]

## 2017-05-05 ENCOUNTER — Encounter: Payer: Self-pay | Admitting: *Deleted

## 2017-05-12 ENCOUNTER — Telehealth: Payer: Self-pay | Admitting: *Deleted

## 2017-05-12 NOTE — Telephone Encounter (Signed)
Fax received from [employer].  Tecfidera PA approved for dates 05/12/17 thru 05/12/18.  Prior Auth Ref # [id number].  Plan Name: [facility] [employer] [facility]-Save.  Plan Code: PH27/fim

## 2017-05-20 ENCOUNTER — Telehealth: Payer: Self-pay | Admitting: Neurology

## 2017-05-20 NOTE — Telephone Encounter (Signed)
Pt is asking for a call back on [phone], she has called to correct the # to reach her on

## 2017-05-20 NOTE — Telephone Encounter (Signed)
Pt called back to let RN know [employer] should be faxing the forms within the next hour or two.  FYI

## 2017-05-20 NOTE — Telephone Encounter (Signed)
Spoke with Jessica [name].  She tolerated Tecfidera 120mg  without side effects.  Took 1st dose of 240mg  today, has some nausea, feels weaker, some lightheaded. No flushing. Took with food. I have offered work note to [employer] home today and tomorrow, but she works in the Surveyor, mining at [employer], [place]. has accrued many points due to absences related to her husband's health issues, and so can't miss work.  She will be off of work at 1430 today, is off tomorrow.  She will have [employer] fax FMLA forms to me and I will complete these asap/fim

## 2017-05-20 NOTE — Telephone Encounter (Signed)
Spoke with Jessica [name] and explained that I received FMLA forms, have completed them, faxed them back to [employer], fax# [phone]/fim

## 2017-05-20 NOTE — Telephone Encounter (Signed)
Pt called she took 1st dose of tecfidera this morning. She is experiencing light headedness, nausea "don't feel like myself". Please call to advise

## 2017-05-27 NOTE — Telephone Encounter (Signed)
Spoke with Jessica [name] and clarified FMLA paperwork/fim

## 2017-05-27 NOTE — Telephone Encounter (Signed)
Pt has called stating there is a problem now with her FMLA paperwork that RN Faith filled out for her and she is asking for a call back

## 2017-05-31 NOTE — Telephone Encounter (Signed)
Pt is asking for a call to discuss additional information she received from [employer] re: her FMLA,  Please call

## 2017-05-31 NOTE — Telephone Encounter (Signed)
Spoke with Azusena. She sts. [employer] has told her that they need clarity on pt's reduced leave 05/20/17 thru 06/20/17 while she is having gi se from Tecfidera.  Amendment added that sts. pt. can miss up to 5 days of work per week, up to 8 hours per day, 05/20/17 thru 06/20/17, while adjusting to the effects of her MS medication. Forms refaxed to [employer], fax# [phone].  Since FMLA forms were amended, copy has been sent to be scanned into Epic/fim

## 2017-06-24 ENCOUNTER — Telehealth: Payer: Self-pay | Admitting: Neurology

## 2017-06-24 NOTE — Telephone Encounter (Signed)
Pt called wanting clarification on if it will be okay for her to give blood while on tecfidera

## 2017-06-24 NOTE — Telephone Encounter (Signed)
Spoke with Anette and explained that Aubagio is on the [employer] med deferral list, but I do not see Tecfidera.  She verbalized understanding of same/fim

## 2017-07-05 ENCOUNTER — Telehealth: Payer: Self-pay | Admitting: Neurology

## 2017-07-05 NOTE — Telephone Encounter (Signed)
Spoke with Jessica [name].  She c/o one day where she had decreased focus/attention, difficulty multitasking at work, but this improved in less than 12 hours.  Denies other sx.  If this becomes a more frequent problem, she will call for an appt. with RAS to discuss tx. options/fim

## 2017-07-05 NOTE — Telephone Encounter (Signed)
Patient calling stating last Saturday she felt confused, forgetful and really out of sorts. This lasted most of the day. Is this normal with having MS or could it be from either oxybutynin (DITROPAN) 5 MG tablet or Dimethyl Fumarate (TECFIDERA) 240 MG CPDR?

## 2017-07-07 DIAGNOSIS — Z0000 Encounter for general adult medical examination without abnormal findings: Secondary | ICD-10-CM | POA: Diagnosis not present

## 2017-07-07 DIAGNOSIS — Z1389 Encounter for screening for other disorder: Secondary | ICD-10-CM | POA: Diagnosis not present

## 2017-07-07 DIAGNOSIS — Z6839 Body mass index (BMI) 39.0-39.9, adult: Secondary | ICD-10-CM | POA: Diagnosis not present

## 2017-07-07 DIAGNOSIS — R300 Dysuria: Secondary | ICD-10-CM | POA: Diagnosis not present

## 2017-07-13 DIAGNOSIS — Z0000 Encounter for general adult medical examination without abnormal findings: Secondary | ICD-10-CM | POA: Diagnosis not present

## 2017-07-13 DIAGNOSIS — Z1389 Encounter for screening for other disorder: Secondary | ICD-10-CM | POA: Diagnosis not present

## 2017-07-13 DIAGNOSIS — Z6839 Body mass index (BMI) 39.0-39.9, adult: Secondary | ICD-10-CM | POA: Diagnosis not present

## 2017-08-02 MED FILL — OXYBUTYNIN 5 MG TABLET: 5 | 90 days supply | Qty: 180 | Fill #1

## 2017-09-08 ENCOUNTER — Ambulatory Visit (INDEPENDENT_AMBULATORY_CARE_PROVIDER_SITE_OTHER): Payer: 59 | Admitting: Neurology

## 2017-09-08 ENCOUNTER — Other Ambulatory Visit: Payer: Self-pay

## 2017-09-08 ENCOUNTER — Encounter: Payer: Self-pay | Admitting: Neurology

## 2017-09-08 VITALS — BP 116/62 | HR 68 | Resp 18 | Ht 67.0 in | Wt 255.5 lb

## 2017-09-08 DIAGNOSIS — R3915 Urgency of urination: Secondary | ICD-10-CM | POA: Diagnosis not present

## 2017-09-08 DIAGNOSIS — Z716 Tobacco abuse counseling: Secondary | ICD-10-CM | POA: Diagnosis not present

## 2017-09-08 DIAGNOSIS — G35 Multiple sclerosis: Secondary | ICD-10-CM

## 2017-09-08 DIAGNOSIS — F329 Major depressive disorder, single episode, unspecified: Secondary | ICD-10-CM | POA: Diagnosis not present

## 2017-09-08 DIAGNOSIS — F32A Depression, unspecified: Secondary | ICD-10-CM

## 2017-09-08 DIAGNOSIS — R200 Anesthesia of skin: Secondary | ICD-10-CM

## 2017-09-08 DIAGNOSIS — G35D Multiple sclerosis, unspecified: Secondary | ICD-10-CM

## 2017-09-08 MED ORDER — BUPROPION HCL ER (XL) 300 MG PO TB24: 300.0000 mg | ORAL_TABLET | Freq: Every day | ORAL | 3 refills | Status: DC

## 2017-09-08 MED ORDER — IMIPRAMINE HCL 25 MG PO TABS: 25.0000 mg | ORAL_TABLET | Freq: Every day | ORAL | 3 refills | Status: DC

## 2017-09-08 MED FILL — IMIPRAMINE HCL 25 MG TABLET: 25 | 90 days supply | Qty: 90 | Fill #0

## 2017-09-08 MED FILL — BUPROPION HCL XL 300 MG TAB: 300 | 90 days supply | Qty: 90 | Fill #0

## 2017-09-08 NOTE — Progress Notes (Signed)
[facility]  PATIENT: Jessica [name] DOB: May 11, 1985  REFERRING DOCTOR OR PCP:  Dr. Lucia Gaskins.   PCP is Social research officer, government SOURCE: Patient, notes from Dr. Lucia Gaskins, imaging and lab results, MRI images on PACS personally reviewed.  _________________________________   HISTORICAL  CHIEF COMPLAINT:  Chief Complaint  Patient presents with  . Multiple Sclerosis    Sts. she continues to tolerate Tecfidera well.  C/O more frequent and severe h/a''s.  Is taking up to 2400mg  Ibuprofen daily. Feels she has more mood swings--is more easily irritated./fim    HISTORY OF PRESENT ILLNESS:  Jessica [name] is a 32 yo woman with multiple sclerosis.  Update 09/08/2017: She was diagnosed with MS earlier this year.  She denies recent MS exacerbation. She has been on the Tecfidera for nearly 4 months,   She is tolerating it well.   She felt lethargic on it the first couple weeks.   She had flushing just a few times and never had GI symptoms.  Gait and balance are doing well.   She could climb a ladder.    Strength and sensation are fine. Facial numbness has resolved.    Bladder function is fine on oxybutynin   Vision is fine.  She feels her fatigue is mild and manageable.    She sleeps well most night.   She is having some mood swings the last month or two.     SHe is easily irritated.    Cognition is fine.     She is noting headaches that are almost daily 28/30 days/month.  The worse pain is in the left forehead and periorbital region.    Ibuprofen [phone] sometimes helps but usually the pain returns in a few hours.   She denies neck pain.   She has photophobia.  No nausea.   She used to get headahces x 6-7 days in the past without nausea and was told she had migraines.     From 05/04/2017: In March 2015, she had an episode of bilateral leg numbness and right arm numbness and MRI was recommended but she signed out against medical device from the emergency room.  She was numb from  the waist down and had a Lhermitte sign.    This lasted 2 months and completely resolved.   She did not follow-up with neurology.   In mid February 2019, she had the onset of left facial dysesthesia followed by numbness including the tongue she also had some trouble swallowing. She had a scalded sensation in the tongue.    She feels she is about the same.   She feels slightly off with walking but balance is not bad.   She is able to climb stairs without problems.     Currently, gait, strength, sensation (outside face) ok.   She has urinary frequency x several months and she has some leakage.   She had one episode of bowel incontinence.  She has fatigue, physical > cognitive.   Sometimes she feels sleepy..   She sleeps well at night.   She had depression in the past and was on a medication but stopped and feels better now.     She has more trouble staying focused than she used to.     I personally reviewed the MRI of the brain dated 04/28/2017. It is abnormal showing multiple T2/flair hyperintense foci in the periventricular, juxtacortical and deep white matter of both hemispheres, with many of the periventricular foci are radially oriented to the ventricles. She  also had a focus in the left middle cerebellar peduncle. None of the foci enhanced.    On the thoracic spine images from 3 years ago, there is a possible T2 level cord focus but study is degraded by movement and interpretation is fairly difficult.    Lab work shows a negative ANA, TSH, CBC and CMP.    REVIEW OF SYSTEMS: Constitutional: No fevers, chills, sweats, or change in appetite Eyes: No visual changes, double vision, eye pain Ear, nose and throat: No hearing loss, ear pain, nasal congestion, sore throat Cardiovascular: No chest pain, palpitations Respiratory: No shortness of breath at rest or with exertion.   No wheezes GastrointestinaI: No nausea, vomiting, diarrhea, abdominal pain, fecal incontinence Genitourinary: No dysuria,  urinary retention or frequency.  No nocturia. Musculoskeletal: No neck pain, back pain Integumentary: No rash, pruritus, skin lesions Neurological: as above Psychiatric: No depression at this time.  No anxiety Endocrine: No palpitations, diaphoresis, change in appetite, change in weigh or increased thirst Hematologic/Lymphatic: No anemia, purpura, petechiae. Allergic/Immunologic: No itchy/runny eyes, nasal congestion, recent allergic reactions, rashes  ALLERGIES: No Known Allergies  HOME MEDICATIONS:  Current Outpatient Medications:  .  cetirizine (ZYRTEC) 10 MG tablet, Take 10 mg by mouth daily., Disp: , Rfl:  .  Dimethyl Fumarate (TECFIDERA) 240 MG CPDR, Take 240 mg by mouth 2 (two) times daily., Disp: , Rfl:  .  ibuprofen (ADVIL,MOTRIN) 200 MG tablet, Take 400 mg by mouth daily as needed. , Disp: , Rfl:  .  levonorgestrel (MIRENA) 20 MCG/24HR IUD, 1 each by Intrauterine route once.  , Disp: , Rfl:  .  buPROPion (WELLBUTRIN XL) 300 MG 24 hr tablet, Take 1 tablet (300 mg total) by mouth daily., Disp: 90 tablet, Rfl: 3 .  imipramine (TOFRANIL) 25 MG tablet, Take 1 tablet (25 mg total) by mouth at bedtime., Disp: 90 tablet, Rfl: 3 .  oxybutynin (DITROPAN) 5 MG tablet, Take 1 tablet (5 mg total) by mouth 2 (two) times daily., Disp: 180 tablet, Rfl: 3 No current facility-administered medications for this visit.   Facility-Administered Medications Ordered in Other Visits:  .  gadopentetate dimeglumine (MAGNEVIST) injection 20 mL, 20 mL, Intravenous, Once PRN, Anson Fret, MD  PAST MEDICAL HISTORY: Past Medical History:  Diagnosis Date  . Chronic hip pain   . Closed left arm fracture    age [age]  . Scoliosis     PAST SURGICAL HISTORY: Past Surgical History:  Procedure Laterality Date  . adnoids    . CHOLECYSTECTOMY    . NASAL SEPTUM SURGERY    . toe nails removed    . TONSILLECTOMY      FAMILY HISTORY: Family History  Problem Relation Age of Onset  . Hypertension  Mother   . Diabetes Mother        prediabetic  . COPD Mother   . Diabetes Paternal Grandfather   . Hypertension Maternal Grandmother   . Other Maternal Grandmother   . Cancer Maternal Grandfather     SOCIAL HISTORY:  Social History   Socioeconomic History  . Marital status: Single    Spouse name: Not on file  . Number of children: Not on file  . Years of education: Not on file  . Highest education level: Not on file  Occupational History  . Not on file  Social Needs  . Financial resource strain: Not on file  . Food insecurity:    Worry: Not on file    Inability: Not on file  .  Transportation needs:    Medical: Not on file    Non-medical: Not on file  Tobacco Use  . Smoking status: Current Every Day Smoker    Packs/day: 1.00    Years: 13.00    Pack years: 13.00    Types: Cigarettes  . Smokeless tobacco: Never Used  Substance and Sexual Activity  . Alcohol use: Yes    Comment: occasional  . Drug use: Yes    Types: Marijuana    Comment: occ  . Sexual activity: Yes    Birth control/protection: IUD  Lifestyle  . Physical activity:    Days per week: Not on file    Minutes per session: Not on file  . Stress: Not on file  Relationships  . Social connections:    Talks on phone: Not on file    Gets together: Not on file    Attends religious service: Not on file    Active member of club or organization: Not on file    Attends meetings of clubs or organizations: Not on file    Relationship status: Not on file  . Intimate partner violence:    Fear of current or ex partner: Not on file    Emotionally abused: Not on file    Physically abused: Not on file    Forced sexual activity: Not on file  Other Topics Concern  . Not on file  Social History Narrative  . Not on file     PHYSICAL EXAM  Vitals:   09/08/17 1523  BP: 116/62  Pulse: 68  Resp: 18  Weight: 255 lb 8 oz (115.9 kg)  Height: 5\' 7"  (1.702 m)    Body mass index is 40.02 kg/m.   General: The  patient is well-developed and well-nourished and in no acute distress  HEENT: Funduscopic evaluation shows normal optic disks and retinal vessels.  Pharynx is nonerythematous.   Neck:  The neck is nontender.  The occiput is nontender.  Cardiovascular: The heart has a regular rate and rhythm with a normal S1 and S2. There were no murmurs, gallops or rubs. Lungs are clear to auscultation.  Skin: Extremities are without significant edema.  Musculoskeletal:  Back is nontender  Neurologic Exam  Mental status: The patient is alert and oriented x 3 at the time of the examination. The patient has apparent normal recent and remote memory, with an apparently normal attention span and concentration ability.   Speech is normal.  Cranial nerves: Extraocular movements are full.  The pupils react to light and accommodation.  Facial strength and sensation is normal.  Trapezius strength is normal.  No dysarthria is noted.  The tongue is midline, and the patient has symmetric elevation of the soft palate. No obvious hearing deficits are noted.  Motor:  Muscle bulk is normal.   Tone is normal. Strength is  5 / 5 in all 4 extremities. Rapid alternating movements are performed normally  Sensory: Sensory testing is intact to pinprick, soft touch and vibration sensation in all 4 extremities.  Coordination: Cerebellar testing reveals good finger-nose-finger and heel-to-shin on the right but slightly reduced on the left  Gait and station: Station is normal.   Gait is normal. Tandem gait is normal. Romberg is negative.   Reflexes: Deep tendon reflexes are symmetric and normal bilaterally in the arms and increase at the knees with spread    There is no ankle clonus.    DIAGNOSTIC DATA (LABS, IMAGING, TESTING) - I reviewed patient records, labs, notes, testing  and imaging myself where available.  Lab Results  Component Value Date   WBC 9.7 04/23/2017   HGB 13.3 04/23/2017   HCT 40.2 04/23/2017   MCV 93  04/23/2017   PLT 268 04/23/2017      Component Value Date/Time   NA 144 04/23/2017 0951   K 4.7 04/23/2017 0951   CL 106 04/23/2017 0951   CO2 21 04/23/2017 0951   GLUCOSE 73 04/23/2017 0951   GLUCOSE 94 03/21/2014 0628   BUN 15 04/23/2017 0951   CREATININE 0.80 04/23/2017 0951   CALCIUM 9.5 04/23/2017 0951   PROT 6.6 04/23/2017 0951   ALBUMIN 4.1 04/23/2017 0951   AST 10 04/23/2017 0951   ALT 16 04/23/2017 0951   ALKPHOS 49 04/23/2017 0951   BILITOT 0.4 04/23/2017 0951   GFRNONAA 99 04/23/2017 0951   GFRAA 114 04/23/2017 0951   Lab Results  Component Value Date   CHOL 166 10/29/2009   HDL 37 (L) 10/29/2009   LDLCALC 103 (H) 10/29/2009   TRIG 129 10/29/2009   CHOLHDL 4.5 Ratio 10/29/2009   No results found for: HGBA1C No results found for: VITAMINB12 Lab Results  Component Value Date   TSH 1.930 04/23/2017       ASSESSMENT AND PLAN  Multiple sclerosis (HCC) - Plan: CBC with Differential/Platelet  Facial numbness  Urinary urgency  Depression, unspecified depression type  Encounter for smoking cessation counseling   1.     She will continue Tecfidera.  Check CBC with differential.   2.      For the migraines, she will start imipramine nightly.  If she does not improve over the next month, consider switching to Topamax.   3.     Continue oxybutynin for urinary dysfunction 4.      We had a long discussion about mood.  Additionally, she is interested in smoking cessation.  Therefore, I will have her start Wellbutrin XL 300 mg.  After 3 to 4 weeks she should try to taper or off tobacco. 5.      Return in 6 months or sooner if there are new or worsening neurologic symptoms.  40-minute face-to-face evaluation with greater than one half the time counseling and coordinating care about her MS, headaches, mood disturbance and smoking cessation.  Alyda Megna A. Epimenio Foot, MD, [facility] 09/08/2017, 5:19 PM Certified in Neurology, Clinical Neurophysiology, Sleep Medicine, Pain  Medicine and Neuroimaging  [facility] [address] [place], [place] [address] [phone]

## 2017-09-09 ENCOUNTER — Telehealth: Payer: Self-pay | Admitting: *Deleted

## 2017-09-09 LAB — CBC WITH DIFFERENTIAL/PLATELET
BASOS: 0 %
Basophils Absolute: 0 10*3/uL (ref 0.0–0.2)
EOS (ABSOLUTE): 0.1 10*3/uL (ref 0.0–0.4)
EOS: 1 %
HEMATOCRIT: 41 % (ref 34.0–46.6)
Hemoglobin: 13.4 g/dL (ref 11.1–15.9)
Immature Grans (Abs): 0 10*3/uL (ref 0.0–0.1)
Immature Granulocytes: 0 %
Lymphocytes Absolute: 2.1 10*3/uL (ref 0.7–3.1)
Lymphs: 23 %
MCH: 29.7 pg (ref 26.6–33.0)
MCHC: 32.7 g/dL (ref 31.5–35.7)
MCV: 91 fL (ref 79–97)
Monocytes Absolute: 0.6 10*3/uL (ref 0.1–0.9)
Monocytes: 6 %
NEUTROS ABS: 6.6 10*3/uL (ref 1.4–7.0)
Neutrophils: 70 %
PLATELETS: 225 10*3/uL (ref 150–450)
RBC: 4.51 x10E6/uL (ref 3.77–5.28)
RDW: 13 % (ref 12.3–15.4)
WBC: 9.4 10*3/uL (ref 3.4–10.8)

## 2017-09-09 NOTE — Telephone Encounter (Signed)
Spoke with Jessica [name] and reviewed below lab results.  She verbalized understanding of same/fim

## 2017-09-09 NOTE — Telephone Encounter (Signed)
-----   Message from Asa Lente, MD sent at 09/09/2017  9:48 AM EDT ----- Please let the patient know that the lab work is fine.

## 2017-09-13 ENCOUNTER — Telehealth: Payer: Self-pay | Admitting: Neurology

## 2017-09-13 NOTE — Telephone Encounter (Signed)
Pt has called asking that the phone # and fax# for [employer] re: her Dimethyl Fumarate (TECFIDERA) 240 MG CPDR be documented. Phone#[phone] fax#[phone].  Pt was informed RN Rushie Goltz is not in office at this time, pt states she will call RN Faith on tomorrow.

## 2017-09-14 NOTE — Telephone Encounter (Signed)
Noted/fim 

## 2017-11-02 MED FILL — OXYBUTYNIN 5 MG TABLET: 5 | 90 days supply | Qty: 180 | Fill #2

## 2017-11-29 MED FILL — BuPROPion HCL ER (XL) 300 M: 300 | 90 days supply | Qty: 90 | Fill #1

## 2017-11-29 MED FILL — IMIPRAMINE HCL 25 MG TABLET: 25 | 90 days supply | Qty: 90 | Fill #1

## 2017-11-30 ENCOUNTER — Telehealth: Payer: Self-pay | Admitting: Neurology

## 2017-11-30 NOTE — Telephone Encounter (Signed)
Spoke with Jessica [name].  She needs her FMLA forms completed again.  Her needs are now more maintenance minded--she has been on Tecfidera for a time now, is adjusted to it, so now needs occ. days, or hours when she can leave work early for fatigue, esp. in legs.Chucky May

## 2017-11-30 NOTE — Telephone Encounter (Signed)
Patient calling to discuss FMLA paperwork that needs to be filled out.

## 2017-12-07 ENCOUNTER — Telehealth: Payer: Self-pay | Admitting: Neurology

## 2017-12-07 NOTE — Telephone Encounter (Signed)
Patient calling to find out if her medications will interfere with her getting a flu shot and TB test. I advised Dr. Epimenio Foot is out of the office and will send to his nurse.

## 2017-12-07 NOTE — Telephone Encounter (Signed)
Spoke with Jessica [name] and explained ok for dead flu vaccine and TB Gold/fim

## 2017-12-09 DIAGNOSIS — Z0289 Encounter for other administrative examinations: Secondary | ICD-10-CM

## 2017-12-09 NOTE — Telephone Encounter (Signed)
FMLA forms completed and returned to Medical Records for distribution per pt's wishes/fim

## 2017-12-10 ENCOUNTER — Telehealth: Payer: Self-pay | Admitting: *Deleted

## 2017-12-10 NOTE — Telephone Encounter (Signed)
I faxed pt FMLA form to [employer].

## 2017-12-23 ENCOUNTER — Encounter: Payer: Self-pay | Admitting: Adult Health

## 2017-12-23 ENCOUNTER — Ambulatory Visit (INDEPENDENT_AMBULATORY_CARE_PROVIDER_SITE_OTHER): Payer: 59 | Admitting: Adult Health

## 2017-12-23 VITALS — BP 117/68 | HR 84 | Ht 68.0 in | Wt 263.0 lb

## 2017-12-23 DIAGNOSIS — N760 Acute vaginitis: Secondary | ICD-10-CM

## 2017-12-23 DIAGNOSIS — Z975 Presence of (intrauterine) contraceptive device: Secondary | ICD-10-CM

## 2017-12-23 DIAGNOSIS — B9689 Other specified bacterial agents as the cause of diseases classified elsewhere: Secondary | ICD-10-CM | POA: Diagnosis not present

## 2017-12-23 DIAGNOSIS — Z30430 Encounter for insertion of intrauterine contraceptive device: Secondary | ICD-10-CM | POA: Insufficient documentation

## 2017-12-23 DIAGNOSIS — N898 Other specified noninflammatory disorders of vagina: Secondary | ICD-10-CM | POA: Diagnosis not present

## 2017-12-23 LAB — POCT WET PREP (WET MOUNT)
Clue Cells Wet Prep Whiff POC: NEGATIVE
WBC WET PREP: POSITIVE

## 2017-12-23 MED ORDER — METRONIDAZOLE 0.75 % VA GEL: 1.0000 | Freq: Every day | VAGINAL | 1 refills | Status: DC

## 2017-12-23 NOTE — Patient Instructions (Signed)
Bacterial Vaginosis Bacterial vaginosis is a vaginal infection that occurs when the normal balance of bacteria in the vagina is disrupted. It results from an overgrowth of certain bacteria. This is the most common vaginal infection among women ages 15-44. Because bacterial vaginosis increases your risk for STIs (sexually transmitted infections), getting treated can help reduce your risk for chlamydia, gonorrhea, herpes, and HIV (human immunodeficiency virus). Treatment is also important for preventing complications in pregnant women, because this condition can cause an early (premature) delivery. What are the causes? This condition is caused by an increase in harmful bacteria that are normally present in small amounts in the vagina. However, the reason that the condition develops is not fully understood. What increases the risk? The following factors may make you more likely to develop this condition:  Having a new sexual partner or multiple sexual partners.  Having unprotected sex.  Douching.  Having an intrauterine device (IUD).  Smoking.  Drug and alcohol abuse.  Taking certain antibiotic medicines.  Being pregnant.  You cannot get bacterial vaginosis from toilet seats, bedding, swimming pools, or contact with objects around you. What are the signs or symptoms? Symptoms of this condition include:  Grey or white vaginal discharge. The discharge can also be watery or foamy.  A fish-like odor with discharge, especially after sexual intercourse or during menstruation.  Itching in and around the vagina.  Burning or pain with urination.  Some women with bacterial vaginosis have no signs or symptoms. How is this diagnosed? This condition is diagnosed based on:  Your medical history.  A physical exam of the vagina.  Testing a sample of vaginal fluid under a microscope to look for a large amount of bad bacteria or abnormal cells. Your health care provider may use a cotton swab  or a small wooden spatula to collect the sample.  How is this treated? This condition is treated with antibiotics. These may be given as a pill, a vaginal cream, or a medicine that is put into the vagina (suppository). If the condition comes back after treatment, a second round of antibiotics may be needed. Follow these instructions at home: Medicines  Take over-the-counter and prescription medicines only as told by your health care provider.  Take or use your antibiotic as told by your health care provider. Do not stop taking or using the antibiotic even if you start to feel better. General instructions  If you have a female sexual partner, tell her that you have a vaginal infection. She should see her health care provider and be treated if she has symptoms. If you have a female sexual partner, he does not need treatment.  During treatment: ? Avoid sexual activity until you finish treatment. ? Do not douche. ? Avoid alcohol as directed by your health care provider. ? Avoid breastfeeding as directed by your health care provider.  Drink enough water and fluids to keep your urine clear or pale yellow.  Keep the area around your vagina and rectum clean. ? Wash the area daily with warm water. ? Wipe yourself from front to back after using the toilet.  Keep all follow-up visits as told by your health care provider. This is important. How is this prevented?  Do not douche.  Wash the outside of your vagina with warm water only.  Use protection when having sex. This includes latex condoms and dental dams.  Limit how many sexual partners you have. To help prevent bacterial vaginosis, it is best to have sex with just   one partner (monogamous).  Make sure you and your sexual partner are tested for STIs.  Wear cotton or cotton-lined underwear.  Avoid wearing tight pants and pantyhose, especially during summer.  Limit the amount of alcohol that you drink.  Do not use any products that  contain nicotine or tobacco, such as cigarettes and e-cigarettes. If you need help quitting, ask your health care provider.  Do not use illegal drugs. Where to find more information:  Centers for Disease Control and Prevention: www.cdc.gov/std  American Sexual Health Association (ASHA): www.ashastd.org  U.S. Department of Health and Human Services, Office on Women's Health: www.womenshealth.gov/ or https://www.womenshealth.gov/a-z-topics/bacterial-vaginosis Contact a health care provider if:  Your symptoms do not improve, even after treatment.  You have more discharge or pain when urinating.  You have a fever.  You have pain in your abdomen.  You have pain during sex.  You have vaginal bleeding between periods. Summary  Bacterial vaginosis is a vaginal infection that occurs when the normal balance of bacteria in the vagina is disrupted.  Because bacterial vaginosis increases your risk for STIs (sexually transmitted infections), getting treated can help reduce your risk for chlamydia, gonorrhea, herpes, and HIV (human immunodeficiency virus). Treatment is also important for preventing complications in pregnant women, because the condition can cause an early (premature) delivery.  This condition is treated with antibiotic medicines. These may be given as a pill, a vaginal cream, or a medicine that is put into the vagina (suppository). This information is not intended to replace advice given to you by your health care provider. Make sure you discuss any questions you have with your health care provider. Document Released: 02/16/2005 Document Revised: 06/22/2016 Document Reviewed: 11/02/2015 Elsevier Interactive Patient Education  2018 Elsevier Inc.  

## 2017-12-23 NOTE — Progress Notes (Signed)
  Subjective:     Patient ID: Jessica [name], female   DOB: 05-23-1985, 32 y.o.   MRN: [id number]  HPI Jessica [name] is a 32 year old white female in complaining of vaginal discharge with odor, for a few weeks, no itching or burning and no new partners.She was diagnosed with MS and has new meds, since last seen. She has IUD.  PCP is [place].  Review of Systems +vaginal discharge with odor Denies itching or burning No new sex partners  Reviewed past medical,surgical, social and family history. Reviewed medications and allergies.     Objective:   Physical Exam BP 117/68 (BP Location: Left Arm, Patient Position: Sitting, Cuff Size: Large)   Pulse 84   Ht 5\' 8"  (1.727 m)   Wt 263 lb (119.3 kg)   BMI 39.99 kg/m   PHQ 9 score 2. Skin warm and dry.Pelvic: external genitalia is normal in appearance no lesions, vagina: white discharge without odor,urethra has no lesions or masses noted, cervix:smooth and bulbous, +IUD strings,uterus: normal size, shape and contour, non tender, no masses felt, adnexa: no masses or tenderness noted. Bladder is non tender and no masses felt. Wet prep: + for clue cells and +WBCs. Nuswab obtained. Examination chaperoned by Federico Flake CMA.    Assessment:     1. Vaginal discharge   2. Vaginal odor   3. BV (bacterial vaginosis)   4. IUD (intrauterine device) in place       Plan:    Nuswab sent Meds ordered this encounter  Medications  . metroNIDAZOLE (METROGEL) 0.75 % vaginal gel    Sig: Place 1 Applicatorful vaginally at bedtime.    Dispense:  70 g    Refill:  1    Order Specific Question:   Supervising Provider    Answer:   Duane Lope H [[id number]]  F/U prn Review handout on BV

## 2017-12-24 ENCOUNTER — Ambulatory Visit (HOSPITAL_COMMUNITY)
Admission: RE | Admit: 2017-12-24 | Discharge: 2017-12-24 | Disposition: A | Discharge status: Home / Self Care | Payer: 59 | Source: Ambulatory Visit | Attending: Internal Medicine | Admitting: Internal Medicine

## 2017-12-24 ENCOUNTER — Other Ambulatory Visit (HOSPITAL_COMMUNITY): Payer: Self-pay | Admitting: Internal Medicine

## 2017-12-24 DIAGNOSIS — R05 Cough: Secondary | ICD-10-CM | POA: Diagnosis not present

## 2017-12-24 DIAGNOSIS — R0602 Shortness of breath: Secondary | ICD-10-CM | POA: Diagnosis not present

## 2017-12-24 DIAGNOSIS — J189 Pneumonia, unspecified organism: Secondary | ICD-10-CM | POA: Diagnosis not present

## 2017-12-24 DIAGNOSIS — E669 Obesity, unspecified: Secondary | ICD-10-CM | POA: Diagnosis not present

## 2017-12-24 DIAGNOSIS — R059 Cough, unspecified: Secondary | ICD-10-CM

## 2017-12-24 DIAGNOSIS — G35 Multiple sclerosis: Secondary | ICD-10-CM | POA: Diagnosis not present

## 2017-12-24 DIAGNOSIS — Z6839 Body mass index (BMI) 39.0-39.9, adult: Secondary | ICD-10-CM | POA: Diagnosis not present

## 2017-12-24 DIAGNOSIS — Z1389 Encounter for screening for other disorder: Secondary | ICD-10-CM | POA: Diagnosis not present

## 2017-12-25 LAB — NUSWAB VAGINITIS PLUS (VG+)
CANDIDA ALBICANS, NAA: NEGATIVE
CHLAMYDIA TRACHOMATIS, NAA: NEGATIVE
Candida glabrata, NAA: NEGATIVE
Neisseria gonorrhoeae, NAA: NEGATIVE
Trich vag by NAA: NEGATIVE

## 2018-02-08 MED FILL — OXYBUTYNIN 5 MG TABLET: 5 | 90 days supply | Qty: 180 | Fill #3

## 2018-02-28 MED FILL — buPROPion HCL ER (XL) 300 M: 300 | 90 days supply | Qty: 90 | Fill #2

## 2018-02-28 MED FILL — IMIPRAMINE HCL 25 MG TABLET: 25 | 90 days supply | Qty: 90 | Fill #2

## 2018-03-07 ENCOUNTER — Telehealth: Payer: Self-pay | Admitting: *Deleted

## 2018-03-07 NOTE — Telephone Encounter (Signed)
Fax received from [employer], on behalf of Hotel [employer]. , phone# [phone]).  Tecfidera PA approved. No dates or PA# given/fim

## 2018-03-15 ENCOUNTER — Encounter: Payer: Self-pay | Admitting: Neurology

## 2018-03-15 ENCOUNTER — Ambulatory Visit (INDEPENDENT_AMBULATORY_CARE_PROVIDER_SITE_OTHER): Payer: No Typology Code available for payment source | Admitting: Neurology

## 2018-03-15 VITALS — BP 102/58 | HR 76 | Ht 68.5 in | Wt 267.0 lb

## 2018-03-15 DIAGNOSIS — Z79899 Other long term (current) drug therapy: Secondary | ICD-10-CM | POA: Diagnosis not present

## 2018-03-15 DIAGNOSIS — R29898 Other symptoms and signs involving the musculoskeletal system: Secondary | ICD-10-CM

## 2018-03-15 DIAGNOSIS — G35 Multiple sclerosis: Secondary | ICD-10-CM | POA: Diagnosis not present

## 2018-03-15 DIAGNOSIS — R3915 Urgency of urination: Secondary | ICD-10-CM

## 2018-03-15 DIAGNOSIS — F32A Depression, unspecified: Secondary | ICD-10-CM

## 2018-03-15 DIAGNOSIS — F329 Major depressive disorder, single episode, unspecified: Secondary | ICD-10-CM

## 2018-03-15 MED ORDER — GABAPENTIN 300 MG PO CAPS: 300.0000 mg | ORAL_CAPSULE | Freq: Three times a day (TID) | ORAL | 11 refills | Status: DC

## 2018-03-15 MED ORDER — DULOXETINE HCL 60 MG PO CPEP: 60.0000 mg | ORAL_CAPSULE | Freq: Every day | ORAL | 3 refills | Status: DC

## 2018-03-15 MED ORDER — DIMETHYL FUMARATE 240 MG PO CPDR: 240.0000 mg | DELAYED_RELEASE_CAPSULE | Freq: Two times a day (BID) | ORAL | 11 refills | Status: DC

## 2018-03-15 MED FILL — DULoxetine HCL 60 MG CPEP: 60 | 90 days supply | Qty: 90 | Fill #0

## 2018-03-15 MED FILL — GABAPENTIN 300 MG CAPSULE: 300 | 30 days supply | Qty: 90 | Fill #0

## 2018-03-15 NOTE — Patient Instructions (Signed)
Stop the imipramine.  Start duloxetine (Cymbalta) 60 mg pill once a day.  You can take this anytime of the day.  Gabapentin work yourself up to 1 pill in the morning, 1 pill in the afternoon and 2 pills at night.

## 2018-03-15 NOTE — Progress Notes (Signed)
[facility]  PATIENT: Jessica [name] DOB: [birth year]  REFERRING DOCTOR OR PCP:  Dr. Lucia GaskinsAhern.   [facility] SOURCE: Patient, notes from Dr. Lucia GaskinsAhern, imaging and lab results, MRI images on PACS personally reviewed.  _________________________________   HISTORICAL  CHIEF COMPLAINT:  Chief Complaint  Patient presents with  . Follow-up    RM 12. Last seen 09/08/2017, her husband has some questions to bring up to Dr Epimenio FootSater  . Multiple Sclerosis    On Tecfidera 240mg  BID    HISTORY OF PRESENT ILLNESS:  Jessica AcresMegan [name] is a 33 y.o. woman with multiple sclerosis.  Update 03/15/2018: Jessica [name] is on Tecfidera for her MS.   Jessica [name] generally tolerates it well.   No GI upset or flushing.   No exacerbation.    Her gait is about the same.   Her legs sometimes feel weak.   Jessica [name] gets a pain sensation in her legs now/then lasting minutes to the entire day.  Bladder is doing well on oxybutynin.    Jessica [name] has 1 x nocturia.    Jessica [name] is having mood swings.   Jessica [name] gets frustrated easily.    Jessica [name] goes from excited to upset very quickly at times.   Jessica [name] is sometimes mildly agitated.    Jessica [name] went on Wellbutrin with some benefit.  Jessica [name] quit smoking but went back. Memory seems worse.   Jessica [name] notes fatigue many afternoons.   Jessica [name] often falls asleep on the couch.   Jessica [name] snores but husband has not noted  OSA (only once has he ever felt Jessica [name] had a long pause).  No snorts or gasps.   Jessica [name] is getting 7 - 8 hours sleep most nights.    Vision is ok but Jessica [name] notes mild sensitivity.  Jessica [name] has pain in her legs.    Jessica [name] has more headaches over the eyes.  No nausea or vomiting.    Jessica [name] is having them about twice a week.   Tylenol or OTC NSAIDs have not helped.     Initially, imipramine helped a lot but less so now.   Jessica [name] has an increased appetite and has gained 10 pounds in last 6 months.     Jessica [name] notes that her voice modulates some while Jessica [name] is talking, especially if talking longer.     Swallowing is ok.   Jessica [name] has a  mild dry mouth from her medication.   However, Jessica [name] feels the voice changes are unrelated to dry mouth.      Update 09/08/2017: Jessica [name] was diagnosed with MS earlier this year.  Jessica [name] denies recent MS exacerbation. Jessica [name] has been on the Tecfidera for nearly 4 months,   Jessica [name] is tolerating it well.   Jessica [name] felt lethargic on it the first couple weeks.   Jessica [name] had flushing just a few times and never had GI symptoms.  Gait and balance are doing well.   Jessica [name] could climb a ladder.    Strength and sensation are fine. Facial numbness has resolved.    Bladder function is fine on oxybutynin   Vision is fine.     Jessica [name] feels her fatigue is mild and manageable.    Jessica [name] sleeps well most night.   Jessica [name] is having some mood swings the last month or two.     Jessica [name] is easily irritated.    Cognition is fine.     Jessica [name] is noting headaches that are almost daily 28/30 days/month.  The worse pain is in the left forehead and periorbital  region.    Ibuprofen [phone] sometimes helps but usually the pain returns in a few hours.   Jessica [name] denies neck pain.   Jessica [name] has photophobia.  No nausea.   Jessica [name] used to get headahces x 6-7 days in the past without nausea and was told Jessica [name] had migraines.     From 05/04/2017: In March 2015, Jessica [name] had an episode of bilateral leg numbness and right arm numbness and MRI was recommended but Jessica [name] signed out against medical device from the emergency room.  Jessica [name] was numb from the waist down and had a Lhermitte sign.    This lasted 2 months and completely resolved.   Jessica [name] did not follow-up with neurology.   In mid February 2019, Jessica [name] had the onset of left facial dysesthesia followed by numbness including the tongue Jessica [name] also had some trouble swallowing. Jessica [name] had a scalded sensation in the tongue.    Jessica [name] feels Jessica [name] is about the same.   Jessica [name] feels slightly off with walking but balance is not bad.   Jessica [name] is able to climb stairs without problems.     Currently, gait, strength, sensation (outside face) ok.   Jessica [name] has urinary frequency x several months  and Jessica [name] has some leakage.   Jessica [name] had one episode of bowel incontinence.  Jessica [name] has fatigue, physical > cognitive.   Sometimes Jessica [name] feels sleepy..   Jessica [name] sleeps well at night.   Jessica [name] had depression in the past and was on a medication but stopped and feels better now.     Jessica [name] has more trouble staying focused than Jessica [name] used to.     I personally reviewed the MRI of the brain dated 04/28/2017. It is abnormal showing multiple T2/flair hyperintense foci in the periventricular, juxtacortical and deep white matter of both hemispheres, with many of the periventricular foci are radially oriented to the ventricles. Jessica [name] also had a focus in the left middle cerebellar peduncle. None of the foci enhanced.    On the thoracic spine images from 3 years ago, there is a possible T2 level cord focus but study is degraded by movement and interpretation is fairly difficult.    Lab work shows a negative ANA, TSH, CBC and CMP.    REVIEW OF SYSTEMS: Constitutional: No fevers, chills, sweats, or change in appetite Eyes: No visual changes, double vision, eye pain Ear, nose and throat: No hearing loss, ear pain, nasal congestion, sore throat Cardiovascular: No chest pain, palpitations Respiratory: No shortness of breath at rest or with exertion.   No wheezes GastrointestinaI: No nausea, vomiting, diarrhea, abdominal pain, fecal incontinence Genitourinary: No dysuria, urinary retention or frequency.  No nocturia. Musculoskeletal: No neck pain, back pain Integumentary: No rash, pruritus, skin lesions Neurological: as above Psychiatric: No depression at this time.  No anxiety Endocrine: No palpitations, diaphoresis, change in appetite, change in weigh or increased thirst Hematologic/Lymphatic: No anemia, purpura, petechiae. Allergic/Immunologic: No itchy/runny eyes, nasal congestion, recent allergic reactions, rashes  ALLERGIES: No Known Allergies  HOME MEDICATIONS:  Current Outpatient Medications:  .  buPROPion (WELLBUTRIN  XL) 300 MG 24 hr tablet, Take 1 tablet (300 mg total) by mouth daily., Disp: 90 tablet, Rfl: 3 .  cetirizine (ZYRTEC) 10 MG tablet, Take 10 mg by mouth daily., Disp: , Rfl:  .  Dimethyl Fumarate (TECFIDERA) 240 MG CPDR, Take 1 capsule (240 mg total) by mouth 2 (two) times daily., Disp: 60 capsule, Rfl: 11 .  ibuprofen (ADVIL,MOTRIN) 200 MG tablet, Take 400 mg by mouth daily as needed. ,  Disp: , Rfl:  .  levonorgestrel (MIRENA) 20 MCG/24HR IUD, 1 each by Intrauterine route once.  , Disp: , Rfl:  .  metroNIDAZOLE (METROGEL) 0.75 % vaginal gel, Place 1 Applicatorful vaginally at bedtime., Disp: 70 g, Rfl: 1 .  oxybutynin (DITROPAN) 5 MG tablet, Take 1 tablet (5 mg total) by mouth 2 (two) times daily., Disp: 180 tablet, Rfl: 3 .  DULoxetine (CYMBALTA) 60 MG capsule, Take 1 capsule (60 mg total) by mouth daily., Disp: 90 capsule, Rfl: 3 .  gabapentin (NEURONTIN) 300 MG capsule, Take 1 capsule (300 mg total) by mouth 3 (three) times daily., Disp: 90 capsule, Rfl: 11 No current facility-administered medications for this visit.   Facility-Administered Medications Ordered in Other Visits:  .  gadopentetate dimeglumine (MAGNEVIST) injection 20 mL, 20 mL, Intravenous, Once PRN, Anson FretAhern, Antonia B, MD  PAST MEDICAL HISTORY: Past Medical History:  Diagnosis Date  . Chronic hip pain   . Closed left arm fracture    age [age]  . MS (multiple sclerosis) (HCC)   . Scoliosis     PAST SURGICAL HISTORY: Past Surgical History:  Procedure Laterality Date  . adnoids    . CHOLECYSTECTOMY    . NASAL SEPTUM SURGERY    . toe nails removed    . TONSILLECTOMY      FAMILY HISTORY: Family History  Problem Relation Age of Onset  . Hypertension Mother   . Diabetes Mother        prediabetic  . COPD Mother   . Diabetes Paternal Grandfather   . Hypertension Maternal Grandmother   . Other Maternal Grandmother   . Addison's disease Maternal Grandmother   . Cancer Maternal Grandfather     SOCIAL  HISTORY:  Social History   Socioeconomic History  . Marital status: Single    Spouse name: Not on file  . Number of children: Not on file  . Years of education: Not on file  . Highest education level: Not on file  Occupational History  . Not on file  Social Needs  . Financial resource strain: Not on file  . Food insecurity:    Worry: Not on file    Inability: Not on file  . Transportation needs:    Medical: Not on file    Non-medical: Not on file  Tobacco Use  . Smoking status: Current Every Day Smoker    Packs/day: 1.00    Years: 13.00    Pack years: 13.00    Types: Cigarettes  . Smokeless tobacco: Never Used  Substance and Sexual Activity  . Alcohol use: Not Currently    Comment: occasional  . Drug use: Yes    Types: Marijuana    Comment: occ  . Sexual activity: Yes    Birth control/protection: I.U.D.  Lifestyle  . Physical activity:    Days per week: Not on file    Minutes per session: Not on file  . Stress: Not on file  Relationships  . Social connections:    Talks on phone: Not on file    Gets together: Not on file    Attends religious service: Not on file    Active member of club or organization: Not on file    Attends meetings of clubs or organizations: Not on file    Relationship status: Not on file  . Intimate partner violence:    Fear of current or ex partner: Not on file    Emotionally abused: Not on file    Physically abused: Not  on file    Forced sexual activity: Not on file  Other Topics Concern  . Not on file  Social History Narrative  . Not on file     PHYSICAL EXAM  Vitals:   03/15/18 1458  BP: (!) 102/58  Pulse: 76  Weight: 267 lb (121.1 kg)  Height: 5' 8.5" (1.74 m)    Body mass index is 40.01 kg/m.   General: The patient is well-developed and well-nourished and in no acute distress  HEENT: Funduscopic evaluation shows normal optic disks and retinal vessels.  Pharynx is nonerythematous.   Neck:  The neck is nontender.   The occiput is nontender.  Cardiovascular: The heart has a regular rate and rhythm with a normal S1 and S2. There were no murmurs, gallops or rubs. Lungs are clear to auscultation.  Skin: Extremities are without significant edema.  Musculoskeletal:  Back is nontender  Neurologic Exam  Mental status: The patient is alert and oriented x 3 at the time of the examination. The patient has apparent normal recent and remote memory, with an apparently normal attention span and concentration ability.   Speech is normal.  Cranial nerves: Extraocular movements are full.  The pupils react to light and accommodation.  Facial strength and sensation is normal.  Trapezius strength is normal.  No dysarthria is noted.  The tongue is midline, and the patient has symmetric elevation of the soft palate. No obvious hearing deficits are noted.  Motor:  Muscle bulk is normal.   Tone is normal. Strength is  5 / 5 in all 4 extremities. Rapid alternating movements are performed normally  Sensory: Sensory testing is intact to pinprick, soft touch and vibration sensation in all 4 extremities.  Coordination: Cerebellar testing reveals good finger-nose-finger and heel-to-shin on the right but slightly reduced on the left  Gait and station: Station is normal.   Gait is normal. Tandem gait is normal. Romberg is negative.   Reflexes: Deep tendon reflexes are symmetric and normal bilaterally in the arms and increase at the knees with spread    There is no ankle clonus.    DIAGNOSTIC DATA (LABS, IMAGING, TESTING) - I reviewed patient records, labs, notes, testing and imaging myself where available.  Lab Results  Component Value Date   WBC 9.4 09/08/2017   HGB 13.4 09/08/2017   HCT 41.0 09/08/2017   MCV 91 09/08/2017   PLT 225 09/08/2017      Component Value Date/Time   NA 144 04/23/2017 0951   K 4.7 04/23/2017 0951   CL 106 04/23/2017 0951   CO2 21 04/23/2017 0951   GLUCOSE 73 04/23/2017 0951   GLUCOSE 94  03/21/2014 0628   BUN 15 04/23/2017 0951   CREATININE 0.80 04/23/2017 0951   CALCIUM 9.5 04/23/2017 0951   PROT 6.6 04/23/2017 0951   ALBUMIN 4.1 04/23/2017 0951   AST 10 04/23/2017 0951   ALT 16 04/23/2017 0951   ALKPHOS 49 04/23/2017 0951   BILITOT 0.4 04/23/2017 0951   GFRNONAA 99 04/23/2017 0951   GFRAA 114 04/23/2017 0951   Lab Results  Component Value Date   CHOL 166 10/29/2009   HDL 37 (L) 10/29/2009   LDLCALC 103 (H) 10/29/2009   TRIG 129 10/29/2009   CHOLHDL 4.5 Ratio 10/29/2009   No results found for: HGBA1C No results found for: VITAMINB12 Lab Results  Component Value Date   TSH 1.930 04/23/2017       ASSESSMENT AND PLAN  Multiple sclerosis (HCC) - Plan: CBC with  Differential/Platelet, MR BRAIN W WO CONTRAST, Comprehensive metabolic panel  High risk medication use - Plan: CBC with Differential/Platelet, Comprehensive metabolic panel  Leg weakness, bilateral  Depression, unspecified depression type  Urinary urgency  OBESITY, MORBID   1.     Continue Tecfidera check CBC with differential and CMP.  MRI of the brain to determine if there is any subclinical progression of her MS.  If this is occurring we need to consider a more efficacious disease modifying therapy. 2.      Stop imipramine.  Add Cymbalta as it may help mood better and have less weight gain and dry mouth.  Add gabapentin. 3.     Continue oxybutynin for urinary dysfunction 4.      Continue Wellbutrin XL 300 mg.   5.      Return in 6 months or sooner if there are new or worsening neurologic symptoms.  40 minutes face-to-face evaluation with greater than one half the time counseling and coordinating care about her MS, headaches, mood disturbance  Richard A. Epimenio Foot, MD, [facility] 03/15/2018, 6:37 PM Certified in Neurology, Clinical Neurophysiology, Sleep Medicine, Pain Medicine and Neuroimaging  [facility] [address] [place], [place] [address] [phone]

## 2018-03-16 ENCOUNTER — Telehealth: Payer: Self-pay | Admitting: Neurology

## 2018-03-16 LAB — COMPREHENSIVE METABOLIC PANEL
A/G RATIO: 1.8 (ref 1.2–2.2)
ALK PHOS: 47 IU/L (ref 39–117)
ALT: 14 IU/L (ref 0–32)
AST: 12 IU/L (ref 0–40)
Albumin: 4.1 g/dL (ref 3.5–5.5)
BUN/Creatinine Ratio: 24 — ABNORMAL HIGH (ref 9–23)
BUN: 17 mg/dL (ref 6–20)
CHLORIDE: 102 mmol/L (ref 96–106)
CO2: 24 mmol/L (ref 20–29)
Calcium: 9.3 mg/dL (ref 8.7–10.2)
Creatinine, Ser: 0.72 mg/dL (ref 0.57–1.00)
GFR calc Af Amer: 128 mL/min/{1.73_m2} (ref >59)
GFR calc non Af Amer: 111 mL/min/{1.73_m2} (ref >59)
GLOBULIN, TOTAL: 2.3 g/dL (ref 1.5–4.5)
Glucose: 75 mg/dL (ref 65–99)
Potassium: 4.9 mmol/L (ref 3.5–5.2)
SODIUM: 138 mmol/L (ref 134–144)
Total Protein: 6.4 g/dL (ref 6.0–8.5)

## 2018-03-16 LAB — CBC WITH DIFFERENTIAL/PLATELET
BASOS ABS: 0 10*3/uL (ref 0.0–0.2)
BASOS: 0 %
EOS (ABSOLUTE): 0.2 10*3/uL (ref 0.0–0.4)
Eos: 2 %
HEMATOCRIT: 39.6 % (ref 34.0–46.6)
Hemoglobin: 13.3 g/dL (ref 11.1–15.9)
IMMATURE GRANULOCYTES: 0 %
Immature Grans (Abs): 0 10*3/uL (ref 0.0–0.1)
LYMPHS ABS: 2.6 10*3/uL (ref 0.7–3.1)
LYMPHS: 25 %
MCH: 30.8 pg (ref 26.6–33.0)
MCHC: 33.6 g/dL (ref 31.5–35.7)
MCV: 92 fL (ref 79–97)
MONOCYTES: 5 %
Monocytes Absolute: 0.6 10*3/uL (ref 0.1–0.9)
Neutrophils Absolute: 7 10*3/uL (ref 1.4–7.0)
Neutrophils: 68 %
Platelets: 267 10*3/uL (ref 150–450)
RBC: 4.32 x10E6/uL (ref 3.77–5.28)
RDW: 12.6 % (ref 11.7–15.4)
WBC: 10.4 10*3/uL (ref 3.4–10.8)

## 2018-03-16 NOTE — Telephone Encounter (Signed)
MR Brain w/wo contrast Dr. Tressie Stalker Focus Berkley Harvey: [phone] (exp. 04/05/18). Patient is scheduled at [facility] for 04/05/18

## 2018-03-17 ENCOUNTER — Encounter: Payer: Self-pay | Admitting: *Deleted

## 2018-03-21 ENCOUNTER — Telehealth: Payer: 59 | Admitting: Family

## 2018-03-21 DIAGNOSIS — R519 Headache, unspecified: Secondary | ICD-10-CM

## 2018-03-21 DIAGNOSIS — J329 Chronic sinusitis, unspecified: Secondary | ICD-10-CM

## 2018-03-21 DIAGNOSIS — H538 Other visual disturbances: Secondary | ICD-10-CM

## 2018-03-21 DIAGNOSIS — R51 Headache: Secondary | ICD-10-CM

## 2018-03-21 NOTE — Progress Notes (Signed)
Based on what you shared with me it looks like you have a serious condition that should be evaluated in a face to face office visit.  NOTE: If you entered your credit card information for this eVisit, you will not be charged. You may see a "hold" on your card for the $30 but that hold will drop off and you will not have a charge processed.  Since you are having a headache and blurred vision, it would be best to follow up face to face to rule out other complications.   If you are having a true medical emergency please call 911.  If you need an urgent face to face visit, [place] has four urgent care centers for your convenience.  If you need care fast and have a high deductible or no insurance consider:   [email or web address] to reserve your spot online an avoid wait times  [facility] [address] [place], [place] [address] 8 am to 8 pm Monday-Friday 10 am to 4 pm Saturday-Sunday *Across the street from [employer]  [address] [place], [address] 8 am to 5 pm Monday-Friday * In the [facility] on the [facility]   The following sites will take your  insurance:  . [facility]  [phone] Get Driving Directions Find a Provider at this Location  [address] [place], [place] [address] . 10 am to 8 pm Monday-Friday . 12 pm to 8 pm Saturday-Sunday   . [facility] Urgent Care at [facility]  [phone] Get Driving Directions Find a Provider at this Location  [address] [place], [place] [address] . 8 am to 8 pm Monday-Friday . 9 am to 6 pm Saturday . 11 am to 6 pm Sunday   . [facility] Health Urgent Care at [facility]  [phone] Get Driving Directions  [address] [place], [place] [address] . 8 am to 8 pm Monday-Friday . 8 am to 4 pm Saturday-Sunday   Your e-visit answers were reviewed by a board certified advanced clinical  practitioner to complete your personal care plan.  Thank you for using e-Visits.

## 2018-04-05 ENCOUNTER — Ambulatory Visit: Payer: No Typology Code available for payment source

## 2018-04-05 DIAGNOSIS — G35 Multiple sclerosis: Secondary | ICD-10-CM

## 2018-04-05 MED ORDER — GADOBENATE DIMEGLUMINE 529 MG/ML IV SOLN
20.0000 mL | Freq: Once | INTRAVENOUS | Status: AC | PRN
  Administered 2018-04-05: 20 mL via INTRAVENOUS

## 2018-04-07 ENCOUNTER — Telehealth: Payer: Self-pay | Admitting: *Deleted

## 2018-04-07 NOTE — Telephone Encounter (Signed)
-----   Message from Asa Lente, MD sent at 04/06/2018  5:44 PM EST ----- Please let her know that the MRI looks good.  It shows MS lesions but there are no new ones in the one focus that was large on the last MRI is much smaller on the current study.

## 2018-04-25 ENCOUNTER — Ambulatory Visit (INDEPENDENT_AMBULATORY_CARE_PROVIDER_SITE_OTHER): Payer: No Typology Code available for payment source | Admitting: Otolaryngology

## 2018-04-25 DIAGNOSIS — J343 Hypertrophy of nasal turbinates: Secondary | ICD-10-CM

## 2018-04-25 DIAGNOSIS — J0101 Acute recurrent maxillary sinusitis: Secondary | ICD-10-CM | POA: Diagnosis not present

## 2018-04-25 DIAGNOSIS — J310 Chronic rhinitis: Secondary | ICD-10-CM | POA: Diagnosis not present

## 2018-04-25 MED FILL — GABAPENTIN 300 MG CAPSULE: 300 | 30 days supply | Qty: 90 | Fill #1

## 2018-05-17 ENCOUNTER — Other Ambulatory Visit: Payer: Self-pay | Admitting: Neurology

## 2018-05-17 MED FILL — OXYBUTYNIN 5 MG TABLET: 5 | 90 days supply | Qty: 180 | Fill #0

## 2018-05-19 ENCOUNTER — Ambulatory Visit (INDEPENDENT_AMBULATORY_CARE_PROVIDER_SITE_OTHER): Payer: No Typology Code available for payment source | Admitting: Otolaryngology

## 2018-05-19 ENCOUNTER — Telehealth: Payer: Self-pay | Admitting: *Deleted

## 2018-05-19 DIAGNOSIS — J310 Chronic rhinitis: Secondary | ICD-10-CM | POA: Diagnosis not present

## 2018-05-19 DIAGNOSIS — J343 Hypertrophy of nasal turbinates: Secondary | ICD-10-CM | POA: Diagnosis not present

## 2018-05-19 DIAGNOSIS — J0101 Acute recurrent maxillary sinusitis: Secondary | ICD-10-CM | POA: Diagnosis not present

## 2018-05-19 DIAGNOSIS — Z0289 Encounter for other administrative examinations: Secondary | ICD-10-CM

## 2018-05-19 NOTE — Telephone Encounter (Signed)
Gave completed/signed FMLA back to medical records to process for pt. 

## 2018-05-20 ENCOUNTER — Telehealth: Payer: Self-pay | Admitting: *Deleted

## 2018-05-20 NOTE — Telephone Encounter (Signed)
I fax pt form to [employer] on 05/20/18

## 2018-05-24 ENCOUNTER — Telehealth: Payer: Self-pay | Admitting: Neurology

## 2018-05-24 MED ORDER — DIMETHYL FUMARATE 240 MG PO CPDR: 240.0000 mg | DELAYED_RELEASE_CAPSULE | Freq: Two times a day (BID) | ORAL | 11 refills | Status: DC

## 2018-05-24 MED FILL — GABAPENTIN 300 MG CAPSULE: 300 | 30 days supply | Qty: 90 | Fill #2 | Status: TO

## 2018-05-24 NOTE — Telephone Encounter (Signed)
Patient called in and stated she is completely out of Dimethyl Fumarate (TECFIDERA) 240 MG CPDR and needs it refilled asap, she forgot to call it in last week

## 2018-05-24 NOTE — Telephone Encounter (Signed)
Called pt back. Advised rx sent 03/2018 with 11 refills to [facility]. She needs them to go to [facility] instead. I escribed rx there. Advised her to ask them to expedite shipment. She will call back if anything further needed.

## 2018-05-27 ENCOUNTER — Telehealth: Payer: No Typology Code available for payment source | Admitting: Nurse Practitioner

## 2018-05-27 DIAGNOSIS — N3000 Acute cystitis without hematuria: Secondary | ICD-10-CM | POA: Diagnosis not present

## 2018-05-27 MED ORDER — CEPHALEXIN 500 MG PO CAPS: 500.0000 mg | ORAL_CAPSULE | Freq: Two times a day (BID) | ORAL | 0 refills | Status: DC

## 2018-05-27 NOTE — Progress Notes (Signed)

## 2018-05-30 NOTE — Telephone Encounter (Signed)
Pt called medication now needs PA. Please call to advise the patient when it has been approved

## 2018-05-30 NOTE — Telephone Encounter (Signed)
Andrey Campanile- Did you get the PA form to complete? If so, can you send ASAP as urgent request?  I called [facility] at [phone]. Faith, RN documented on 03/07/18 that PA approved and on file. They stated this PA was only good until 05/12/18. Another PA needed. They faxed PA form to be completed on 05/26/18 to our office. She will also send email for them to re-send PA form to be completed. Can also call them back for them to re-send if needed.

## 2018-05-30 NOTE — Telephone Encounter (Signed)
Called [facility] and they will resend fax.  I received fax [phone].

## 2018-05-30 NOTE — Telephone Encounter (Signed)
Noted, have not seen.

## 2018-05-30 NOTE — Telephone Encounter (Addendum)
PA form completed for tecfidera , faxed along with ofv note, demogs, insurance.  Fax confirmation received [phone], [phone] ofv. [facility].

## 2018-05-31 NOTE — Telephone Encounter (Signed)
I called [facility] at [phone] d/t PA that was faxed in yesterday was not marked urgent. Spoke with Morrie Sheldon. She sent email asking it be reviewed urgently since patient out of medication. Should have an update within an hour.  Can dispense up to a 5 day supply if pharmacy calls [facility] and states she needs medication and emergency. I called [facility] at [phone]. Spoke [place]. She will go ahead and have an agent contact [facility] to try and get this set up. They will call back if anything further needed.   I called patient and informed her of the information above. She will call me back if any further questions/concerns.

## 2018-06-01 NOTE — Telephone Encounter (Signed)
Noted  

## 2018-06-01 NOTE — Telephone Encounter (Signed)
Received [facility] approval for tecfidera 240mg  caps (1 po bid).  Good from 05-31-18 thru 05-30-19.  [phone].  PA ref # [id number].

## 2018-06-13 ENCOUNTER — Telehealth: Payer: No Typology Code available for payment source | Admitting: Physician Assistant

## 2018-06-13 DIAGNOSIS — R300 Dysuria: Secondary | ICD-10-CM

## 2018-06-13 NOTE — Progress Notes (Signed)
Based on what you shared with me and that you have been seen and treated with antibiotics for this same problem less than one month ago, I feel your condition warrants further evaluation and I recommend that you be seen for a face to face office visit.     NOTE: If you entered your credit card information for this eVisit, you will not be charged. You may see a "hold" on your card for the $35 but that hold will drop off and you will not have a charge processed.  If you are having a true medical emergency please call 911.  If you need an urgent face to face visit, [place] has four urgent care centers for your convenience.    PLEASE NOTE: THE INSTACARE LOCATIONS AND URGENT CARE CLINICS DO NOT HAVE THE TESTING FOR CORONAVIRUS COVID19 AVAILABLE.  IF YOU FEEL YOU NEED THIS TEST YOU MUST GO TO A TRIAGE LOCATION AT ONE OF THE HOSPITAL EMERGENCY DEPARTMENTS   [email or web address] to reserve your spot online an avoid wait times  [facility] [address] [place], [place] [address] Modified hours of operation: Monday-Friday, 10 AM to 6 PM  Saturday & Sunday 10 AM to 4 PM *Across the street from [employer]  [employer] (New Address!) [address] [place], [place] [address] *Just off [employer], across the road from [place]* Modified hours of operation: Monday-Friday, 10 AM to 5 PM  Closed Saturday & Sunday   The following sites will take your insurance:  . [facility]  [phone] Get Driving Directions Find a Provider at this Location  [address] [place], [place] [address] . 10 am to 8 pm Monday-Friday . 12 pm to 8 pm Saturday-Sunday   . [facility] Urgent Care at [facility]  [phone] Get Driving Directions Find a Provider at this Location  [address] [place], [place] [address] . 8 am to 8 pm Monday-Friday . 9 am to 6 pm Saturday . 11 am to 6 pm Sunday    . [facility] Urgent Care at [facility]  [phone] Get Driving Directions  [address] [place], [place] [address] . 8 am to 8 pm Monday-Friday . 8 am to 4 pm Saturday-Sunday   Your e-visit answers were reviewed by a board certified advanced clinical practitioner to complete your personal care plan.  Thank you for using e-Visits.

## 2018-06-14 MED FILL — buPROPion HCL ER (XL) 300 M: 300 | 90 days supply | Qty: 90 | Fill #0

## 2018-06-14 MED FILL — DULOXETINE HCL 60 MG CPEP: 60 | 90 days supply | Qty: 90 | Fill #0

## 2018-06-18 MED FILL — GABAPENTIN 300 MG CAPSULE: 300 | 30 days supply | Qty: 90 | Fill #0

## 2018-06-27 ENCOUNTER — Telehealth: Payer: Self-pay | Admitting: *Deleted

## 2018-06-27 NOTE — Telephone Encounter (Signed)
Gave completed/signed FMLA back to medical records to process for pt. Dr. Epimenio Foot is writing her out of work from 06/23/18 until 07/31/18 d/t covid-19 concerns and her being on Tecfidera for MS.

## 2018-06-28 DIAGNOSIS — Z0289 Encounter for other administrative examinations: Secondary | ICD-10-CM

## 2018-07-12 ENCOUNTER — Other Ambulatory Visit: Payer: Self-pay | Admitting: Otolaryngology

## 2018-07-12 ENCOUNTER — Other Ambulatory Visit (HOSPITAL_COMMUNITY): Payer: Self-pay | Admitting: Otolaryngology

## 2018-07-12 DIAGNOSIS — J329 Chronic sinusitis, unspecified: Secondary | ICD-10-CM

## 2018-08-01 NOTE — Telephone Encounter (Signed)
Dr. Sater- are you ok with this? °

## 2018-08-01 NOTE — Telephone Encounter (Signed)
Pt has called to inform that she would like to know if her leave of absence could be extended out a few more weeks due to the increase in cases of the Covid-19.  Please call

## 2018-08-03 MED FILL — GABAPENTIN 300 MG CAPSULE: 300 | 30 days supply | Qty: 90 | Fill #1

## 2018-08-03 NOTE — Telephone Encounter (Signed)
She is on Tecfidera.  We can make this for July 1 (or sooner if she prefers)

## 2018-08-04 NOTE — Telephone Encounter (Signed)
Gave addended paperwork back to medical records to process for pt.

## 2018-08-04 NOTE — Telephone Encounter (Signed)
I called pt. Relayed Dr. Bonnita Hollow message. She does not have enough FMLA time to go until 08/31/2018. She requested it be extended until 08/20/2018. She will let us know if this gets denied and then will ask for return to work letter. Advised we will addend previous paperwork and send to [employer]. No need for another 50.00 for this. Pt verbalized understanding.

## 2018-09-12 ENCOUNTER — Other Ambulatory Visit: Payer: Self-pay

## 2018-09-12 ENCOUNTER — Encounter: Payer: Self-pay | Admitting: Women's Health

## 2018-09-12 ENCOUNTER — Ambulatory Visit (INDEPENDENT_AMBULATORY_CARE_PROVIDER_SITE_OTHER): Payer: No Typology Code available for payment source | Admitting: Women's Health

## 2018-09-12 VITALS — BP 121/82 | HR 83 | Ht 68.0 in | Wt 291.0 lb

## 2018-09-12 DIAGNOSIS — Z30432 Encounter for removal of intrauterine contraceptive device: Secondary | ICD-10-CM | POA: Diagnosis not present

## 2018-09-12 DIAGNOSIS — Z30430 Encounter for insertion of intrauterine contraceptive device: Secondary | ICD-10-CM

## 2018-09-12 DIAGNOSIS — Z3202 Encounter for pregnancy test, result negative: Secondary | ICD-10-CM

## 2018-09-12 MED ORDER — LEVONORGESTREL 20 MCG/24HR IU IUD
INTRAUTERINE_SYSTEM | Freq: Once | INTRAUTERINE | Status: AC
  Administered 2018-09-12: 1 via INTRAUTERINE

## 2018-09-12 NOTE — Addendum Note (Signed)
Addended by: Christiana Pellant A on: 09/12/2018 05:02 PM   Modules accepted: Orders

## 2018-09-12 NOTE — Progress Notes (Signed)
   IUD REMOVAL & RE-INSERTION Patient name: Jessica [name] MRN [id number]  Date of birth: @DOB  Subjective Findings:   @Jessica  [name] is a 33 y.o. G36P1011 Caucasian female being seen today for removal of a Mirena IUD and insertion of a Mirena IUD. Her IUD was placed 2015.   No LMP recorded. (Menstrual status: IUD). Last pap3/27/18. Results were:  neg w/ -HRHPV  The risks and benefits of the method and placement have been thouroughly reviewed with the patient and all questions were answered.  Specifically the patient is aware of failure rate of 03/998, expulsion of the IUD and of possible perforation.  The patient is aware of irregular bleeding due to the method and understands the incidence of irregular bleeding diminishes with time.  Signed copy of informed consent in chart.  Pertinent History Reviewed:   Reviewed past medical,surgical, social, obstetrical and family history.  Reviewed problem list, medications and allergies. Objective Findings & Procedure:    Vitals:   09/12/18 1612  BP: 121/82  Pulse: 83  Weight: 291 lb (132 kg)  Height: 5\' 8"  (1.727 m)  Body mass index is 44.25 kg/m.  No results found for this or any previous visit (from the past 24 hour(s)).   Time out was performed.  A graves speculum was placed in the vagina.  The cervix was visualized, prepped using Betadine. The strings were visible. They were grasped and the Mirena IUD was easily removed. The cervix was then grasped with a single-tooth tenaculum. The uterus was found to be anteroflexed and it sounded to 8 cm.  Mirena IUD placed per manufacturer's recommendations without complications. The strings were trimmed to approximately 3 cm.  The patient tolerated the procedure well.   Informal transvaginal sonogram was performed and the proper placement of the IUD was verified.  Assessment & Plan:   1) Mirena IUD removal & re-insertion The patient was given post procedure instructions, including signs and  symptoms of infection and to check for the strings after each menses or each month, and refraining from intercourse or anything in the vagina for 3 days. She was given a Mirena care card with date IUD placed, and date IUD to be removed. She is scheduled for a f/u appointment in 4 weeks.  No orders of the defined types were placed in this encounter.   Follow-up: Return in about 4 weeks (around 10/10/2018) for F/U, Webex.  [place], [facility] 09/12/2018 4:50 PM

## 2018-09-12 NOTE — Patient Instructions (Signed)
 Nothing in vagina for 3 days (no sex, douching, tampons, etc...)  Check your strings once a month to make sure you can feel them, if you are not able to please let us know  If you develop a fever of 100.4 or more in the next few weeks, or if you develop severe abdominal pain, please let us know  Use a backup method of birth control, such as condoms, for 2 weeks    Intrauterine Device Insertion, Care After  This sheet gives you information about how to care for yourself after your procedure. Your health care provider may also give you more specific instructions. If you have problems or questions, contact your health care provider. What can I expect after the procedure? After the procedure, it is common to have:  Cramps and pain in the abdomen.  Light bleeding (spotting) or heavier bleeding that is like your menstrual period. This may last for up to a few days.  Lower back pain.  Dizziness.  Headaches.  Nausea. Follow these instructions at home:  Before resuming sexual activity, check to make sure that you can feel the IUD string(s). You should be able to feel the end of the string(s) below the opening of your cervix. If your IUD string is in place, you may resume sexual activity. ? If you had a hormonal IUD inserted more than 7 days after your most recent period started, you will need to use a backup method of birth control for 7 days after IUD insertion. Ask your health care provider whether this applies to you.  Continue to check that the IUD is still in place by feeling for the string(s) after every menstrual period, or once a month.  Take over-the-counter and prescription medicines only as told by your health care provider.  Do not drive or use heavy machinery while taking prescription pain medicine.  Keep all follow-up visits as told by your health care provider. This is important. Contact a health care provider if:  You have bleeding that is heavier or lasts longer than  a normal menstrual cycle.  You have a fever.  You have cramps or abdominal pain that get worse or do not get better with medicine.  You develop abdominal pain that is new or is not in the same area of earlier cramping and pain.  You feel lightheaded or weak.  You have abnormal or bad-smelling discharge from your vagina.  You have pain during sexual activity.  You have any of the following problems with your IUD string(s): ? The string bothers or hurts you or your sexual partner. ? You cannot feel the string. ? The string has gotten longer.  You can feel the IUD in your vagina.  You think you may be pregnant, or you miss your menstrual period.  You think you may have an STI (sexually transmitted infection). Get help right away if:  You have flu-like symptoms.  You have a fever and chills.  You can feel that your IUD has slipped out of place. Summary  After the procedure, it is common to have cramps and pain in the abdomen. It is also common to have light bleeding (spotting) or heavier bleeding that is like your menstrual period.  Continue to check that the IUD is still in place by feeling for the string(s) after every menstrual period, or once a month.  Keep all follow-up visits as told by your health care provider. This is important.  Contact your health care provider if   you have problems with your IUD string(s), such as the string getting longer or bothering you or your sexual partner. This information is not intended to replace advice given to you by your health care provider. Make sure you discuss any questions you have with your health care provider. Document Released: 10/15/2010 Document Revised: 01/29/2017 Document Reviewed: 01/08/2016 Elsevier Patient Education  2020 Elsevier Inc.  

## 2018-09-13 ENCOUNTER — Ambulatory Visit (INDEPENDENT_AMBULATORY_CARE_PROVIDER_SITE_OTHER): Payer: No Typology Code available for payment source | Admitting: Neurology

## 2018-09-13 ENCOUNTER — Encounter: Payer: Self-pay | Admitting: Neurology

## 2018-09-13 VITALS — BP 102/70 | HR 61 | Temp 97.5°F | Ht 68.0 in | Wt 292.0 lb

## 2018-09-13 DIAGNOSIS — R270 Ataxia, unspecified: Secondary | ICD-10-CM

## 2018-09-13 DIAGNOSIS — G4719 Other hypersomnia: Secondary | ICD-10-CM | POA: Diagnosis not present

## 2018-09-13 DIAGNOSIS — R0683 Snoring: Secondary | ICD-10-CM | POA: Diagnosis not present

## 2018-09-13 DIAGNOSIS — R3915 Urgency of urination: Secondary | ICD-10-CM

## 2018-09-13 DIAGNOSIS — G35 Multiple sclerosis: Secondary | ICD-10-CM

## 2018-09-13 DIAGNOSIS — G35D Multiple sclerosis, unspecified: Secondary | ICD-10-CM

## 2018-09-13 MED ORDER — SOLIFENACIN SUCCINATE 10 MG PO TABS: 10.0000 mg | ORAL_TABLET | Freq: Every day | ORAL | 3 refills | Status: DC

## 2018-09-13 MED FILL — SOLIFENACIN SUCCINATE 10 MG: 10 | 90 days supply | Qty: 90 | Fill #0

## 2018-09-13 NOTE — Progress Notes (Signed)
[facility]  PATIENT: Jessica [name] DOB: Jan 10, 1986  REFERRING DOCTOR OR PCP:  Dr. Lucia Gaskins.   PCP is Social research officer, government SOURCE: Patient, notes from Dr. Lucia Gaskins, imaging and lab results, MRI images on PACS personally reviewed.  _________________________________   HISTORICAL  CHIEF COMPLAINT:  Chief Complaint  Patient presents with   Follow-up    RM 13, alone.  Last seen 03/15/18. FMLA for work ended on 08/20/18.    Multiple Sclerosis    On Tecfidera but been out for almost a week. There was a problem with copay assistance. Should be delivered to her today. Taking cymbalta, gabapentin. Oxybutynin for bladder issues. Wellbutrin for mood.     HISTORY OF PRESENT ILLNESS:  Jessica [name] is a 33 y.o. woman with multiple sclerosis.  Update 09/13/2018: She is on Tecfidera and generally tolerates it well.   She was having some issues with copay assistance but that has been sorted out.    She walks one mile in the mornings.   No falls or stumbles.  She only holds the bannister if she has knee pain.    She denies any weakness or numbness.     She has urinary urgency and rare urge incontinence.   Oxybutynin did not help much.  Vision is fine.  She feels her fatigue is doing worse.    She is more tired and sleepy.   She feels wiped out even with minimal activity.   She sleeps 12+ hours most days (14-15 on days off).   She snores but has never been told she has OSA signs.    She used to snore worse before ENT surgery 13-14 years ago.    She notes less depression since starting Cymbalta and is still on Wellbutrin. . Cognition is doing well.  MRI Brain 04/05/2018 IMPRESSION: This MRI of the brain with and without contrast shows the following: 1.    Multiple T2/flair hyperintense foci in the hemispheres and pons in a pattern and configuration consistent with chronic demyelinating plaque associated with multiple sclerosis.  None of the foci enhance or appear to be acute.   The large left middle cerebellar peduncle focus present on the 04/28/2017 MRI is much less apparent on the current.  There were no new lesions. 2.    No acute findings and a normal enhancement pattern.  EPWORTH SLEEPINESS SCALE (09/13/2018)  On a scale of 0 - 3 what is the chance of dozing:  Sitting and Reading:   3 Watching TV:    3 Sitting inactive in a public place: 2 Passenger in car for one hour: 0  (never a passenger) Lying down to rest in the afternoon: 3 Sitting and talking to someone: 0 Sitting quietly after lunch:  3 In a car, stopped in traffic:  0  Total (out of 24):   14/24 (mild to moderate excessive sleepiness)   Update 03/15/2018: She is on Tecfidera for her MS.   She generally tolerates it well.   No GI upset or flushing.   No exacerbation.    Her gait is about the same.   Her legs sometimes feel weak.   She gets a pain sensation in her legs now/then lasting minutes to the entire day.  Bladder is doing well on oxybutynin.    She has 1 x nocturia.    She is having mood swings.   She gets frustrated easily.    She goes from excited to upset very quickly at times.   She is  sometimes mildly agitated.    She went on Wellbutrin with some benefit.  She quit smoking but went back. Memory seems worse.   She notes fatigue many afternoons.   She often falls asleep on the couch.   She snores but husband has not noted  OSA (only once has he ever felt she had a long pause).  No snorts or gasps.   She is getting 7 - 8 hours sleep most nights.    Vision is ok but she notes mild sensitivity.  She has pain in her legs.    She has more headaches over the eyes.  No nausea or vomiting.    She is having them about twice a week.   Tylenol or OTC NSAIDs have not helped.     Initially, imipramine helped a lot but less so now.   She has an increased appetite and has gained 10 pounds in last 6 months.     She notes that her voice modulates some while she is talking, especially if talking longer.      Swallowing is ok.   She has a mild dry mouth from her medication.   However, she feels the voice changes are unrelated to dry mouth.      Update 09/08/2017: She was diagnosed with MS earlier this year.  She denies recent MS exacerbation. She has been on the Tecfidera for nearly 4 months,   She is tolerating it well.   She felt lethargic on it the first couple weeks.   She had flushing just a few times and never had GI symptoms.  Gait and balance are doing well.   She could climb a ladder.    Strength and sensation are fine. Facial numbness has resolved.    Bladder function is fine on oxybutynin   Vision is fine.     She feels her fatigue is mild and manageable.    She sleeps well most night.   She is having some mood swings the last month or two.     SHe is easily irritated.    Cognition is fine.     She is noting headaches that are almost daily 28/30 days/month.  The worse pain is in the left forehead and periorbital region.    Ibuprofen [phone] sometimes helps but usually the pain returns in a few hours.   She denies neck pain.   She has photophobia.  No nausea.   She used to get headahces x 6-7 days in the past without nausea and was told she had migraines.     From 05/04/2017: In March 2015, she had an episode of bilateral leg numbness and right arm numbness and MRI was recommended but she signed out against medical device from the emergency room.  She was numb from the waist down and had a Lhermitte sign.    This lasted 2 months and completely resolved.   She did not follow-up with neurology.   In mid February 2019, she had the onset of left facial dysesthesia followed by numbness including the tongue she also had some trouble swallowing. She had a scalded sensation in the tongue.    She feels she is about the same.   She feels slightly off with walking but balance is not bad.   She is able to climb stairs without problems.     Currently, gait, strength, sensation (outside face) ok.   She has  urinary frequency x several months and she has some leakage.  She had one episode of bowel incontinence.  She has fatigue, physical > cognitive.   Sometimes she feels sleepy..   She sleeps well at night.   She had depression in the past and was on a medication but stopped and feels better now.     She has more trouble staying focused than she used to.     I personally reviewed the MRI of the brain dated 04/28/2017. It is abnormal showing multiple T2/flair hyperintense foci in the periventricular, juxtacortical and deep white matter of both hemispheres, with many of the periventricular foci are radially oriented to the ventricles. She also had a focus in the left middle cerebellar peduncle. None of the foci enhanced.    On the thoracic spine images from 3 years ago, there is a possible T2 level cord focus but study is degraded by movement and interpretation is fairly difficult.    Lab work shows a negative ANA, TSH, CBC and CMP.    REVIEW OF SYSTEMS: Constitutional: No fevers, chills, sweats, or change in appetite Eyes: No visual changes, double vision, eye pain Ear, nose and throat: No hearing loss, ear pain, nasal congestion, sore throat Cardiovascular: No chest pain, palpitations Respiratory: No shortness of breath at rest or with exertion.   No wheezes GastrointestinaI: No nausea, vomiting, diarrhea, abdominal pain, fecal incontinence Genitourinary: No dysuria, urinary retention or frequency.  No nocturia. Musculoskeletal: No neck pain, back pain Integumentary: No rash, pruritus, skin lesions Neurological: as above Psychiatric: No depression at this time.  No anxiety Endocrine: No palpitations, diaphoresis, change in appetite, change in weigh or increased thirst Hematologic/Lymphatic: No anemia, purpura, petechiae. Allergic/Immunologic: No itchy/runny eyes, nasal congestion, recent allergic reactions, rashes  ALLERGIES: No Known Allergies  HOME MEDICATIONS:  Current Outpatient  Medications:    buPROPion (WELLBUTRIN XL) 300 MG 24 hr tablet, Take 1 tablet (300 mg total) by mouth daily., Disp: 90 tablet, Rfl: 3   cetirizine (ZYRTEC) 10 MG tablet, Take 10 mg by mouth daily., Disp: , Rfl:    DULoxetine (CYMBALTA) 60 MG capsule, Take 1 capsule (60 mg total) by mouth daily., Disp: 90 capsule, Rfl: 3   gabapentin (NEURONTIN) 300 MG capsule, Take 1 capsule (300 mg total) by mouth 3 (three) times daily., Disp: 90 capsule, Rfl: 11   ibuprofen (ADVIL,MOTRIN) 200 MG tablet, Take 400 mg by mouth daily as needed. , Disp: , Rfl:    levonorgestrel (MIRENA) 20 MCG/24HR IUD, 1 each by Intrauterine route once.  , Disp: , Rfl:    oxybutynin (DITROPAN) 5 MG tablet, TAKE 1 TABLET BY MOUTH 2 TIMES DAILY., Disp: 180 tablet, Rfl: 3   Dimethyl Fumarate (TECFIDERA) 240 MG CPDR, Take 1 capsule (240 mg total) by mouth 2 (two) times daily. (Patient not taking: Reported on 09/13/2018), Disp: 60 capsule, Rfl: 11 No current facility-administered medications for this visit.   Facility-Administered Medications Ordered in Other Visits:    gadopentetate dimeglumine (MAGNEVIST) injection 20 mL, 20 mL, Intravenous, Once PRN, Anson FretAhern, Antonia B, MD  PAST MEDICAL HISTORY: Past Medical History:  Diagnosis Date   Chronic hip pain    Closed left arm fracture    age [age]   MS (multiple sclerosis) (H[facility])    Scoliosis     PAST SURGICAL HISTORY: Past Surgical History:  Procedure Laterality Date   adnoids     CHOLECYSTECTOMY     NASAL SEPTUM SURGERY     toe nails removed     TONSILLECTOMY      FAMILY HISTORY: Family  History  Problem Relation Age of Onset   Hypertension Mother    Diabetes Mother        prediabetic   COPD Mother    Diabetes Paternal Grandfather    Hypertension Maternal Grandmother    Other Maternal Grandmother    Addison's disease Maternal Grandmother    Cancer Maternal Grandfather     SOCIAL HISTORY:  Social History   Socioeconomic History    Marital status: Married    Spouse name: Not on file   Number of children: Not on file   Years of education: Not on file   Highest education level: Not on file  Occupational History   Not on file  Social Needs   Financial resource strain: Not on file   Food insecurity    Worry: Not on file    Inability: Not on file   Transportation needs    Medical: Not on file    Non-medical: Not on file  Tobacco Use   Smoking status: Current Every Day Smoker    Packs/day: 1.00    Years: 13.00    Pack years: 13.00    Types: Cigarettes   Smokeless tobacco: Never Used  Substance and Sexual Activity   Alcohol use: Not Currently    Comment: occasional   Drug use: Yes    Types: Marijuana    Comment: occ   Sexual activity: Yes    Birth control/protection: I.U.D.  Lifestyle   Physical activity    Days per week: Not on file    Minutes per session: Not on file   Stress: Not on file  Relationships   Social connections    Talks on phone: Not on file    Gets together: Not on file    Attends religious service: Not on file    Active member of club or organization: Not on file    Attends meetings of clubs or organizations: Not on file    Relationship status: Not on file   Intimate partner violence    Fear of current or ex partner: Not on file    Emotionally abused: Not on file    Physically abused: Not on file    Forced sexual activity: Not on file  Other Topics Concern   Not on file  Social History Narrative   Not on file     PHYSICAL EXAM  Vitals:   09/13/18 1438  BP: 102/70  Pulse: 61  Temp: (!) 97.5 F (36.4 C)  Weight: 292 lb (132.5 kg)  Height: 5\' 8"  (1.727 m)    Body mass index is 44.4 kg/m.   General: The patient is well-developed and well-nourished and in no acute distress.   Sclera are anicteric.   Neck with good ROM   Neurologic Exam  Mental status: The patient is alert and oriented x 3 at the time of the examination. The patient has apparent  normal recent and remote memory, with an apparently normal attention span and concentration ability.   Speech is normal.  Cranial nerves: Extraocular movements are full.  The pupils react to light and accommodation.  Color vision is symmetric.  Facial strength and sensation is normal.  Trapezius strength is normal.  No dysarthria is noted.  The tongue is midline, and the patient has symmetric elevation of the soft palate. No obvious hearing deficits are noted.  Motor:  Muscle bulk is normal.   Tone is normal. Strength is  5 / 5 in all 4 extremities. Rapid alternating movements are performed  normally  Sensory: Sensory testing is intact to pinprick, soft touch and vibration sensation in all 4 extremities.  Coordination: Cerebellar testing reveals good finger-nose-finger and heel-to-shin is now normal and symmetric  Gait and station: Station is normal.   Gait and tandem gait are normal. Romberg is negative.   Reflexes: Deep tendon reflexes are symmetric and normal bilaterally in the arms and increase at the knees with spread    There is no ankle clonus.    DIAGNOSTIC DATA (LABS, IMAGING, TESTING) - I reviewed patient records, labs, notes, testing and imaging myself where available.  Lab Results  Component Value Date   WBC 10.4 03/15/2018   HGB 13.3 03/15/2018   HCT 39.6 03/15/2018   MCV 92 03/15/2018   PLT 267 03/15/2018      Component Value Date/Time   NA 138 03/15/2018 1544   K 4.9 03/15/2018 1544   CL 102 03/15/2018 1544   CO2 24 03/15/2018 1544   GLUCOSE 75 03/15/2018 1544   GLUCOSE 94 03/21/2014 0628   BUN 17 03/15/2018 1544   CREATININE 0.72 03/15/2018 1544   CALCIUM 9.3 03/15/2018 1544   PROT 6.4 03/15/2018 1544   ALBUMIN 4.1 03/15/2018 1544   AST 12 03/15/2018 1544   ALT 14 03/15/2018 1544   ALKPHOS 47 03/15/2018 1544   BILITOT <0.2 03/15/2018 1544   GFRNONAA 111 03/15/2018 1544   GFRAA 128 03/15/2018 1544   Lab Results  Component Value Date   CHOL 166 10/29/2009    HDL 37 (L) 10/29/2009   LDLCALC 103 (H) 10/29/2009   TRIG 129 10/29/2009   CHOLHDL 4.5 Ratio 10/29/2009   No results found for: HGBA1C No results found for: VITAMINB12 Lab Results  Component Value Date   TSH 1.930 04/23/2017       ASSESSMENT AND PLAN  1. Multiple sclerosis ([place])   2. Excessive daytime sleepiness   3. Snoring   4. Ataxia   5. Urinary urgency     1.     Continue Tecfidera check CBC with differential 2.      Continue Wellbutrin Xl and Cymbalta for depression 3.     D/c oxybutynin.   Trial of Vesicare for urinary dysfunction 4.     Home Sleep study to assses for OSA.   Snoring and EDS (ESS=14) 5.     Return in 6 months or sooner if there are new or worsening neurologic symptoms.   Sameka Bagent A. Felecia Shelling, MD, [facility] 06/26/8339, 9:62 PM Certified in Neurology, Clinical Neurophysiology, Sleep Medicine, Pain Medicine and Neuroimaging  [facility] [address] [place], [place] [address] [phone]

## 2018-09-14 ENCOUNTER — Telehealth: Payer: Self-pay | Admitting: *Deleted

## 2018-09-14 LAB — CBC WITH DIFFERENTIAL/PLATELET
Basophils Absolute: 0 10*3/uL (ref 0.0–0.2)
Basos: 0 %
EOS (ABSOLUTE): 0.1 10*3/uL (ref 0.0–0.4)
Eos: 1 %
Hematocrit: 43.2 % (ref 34.0–46.6)
Hemoglobin: 14.3 g/dL (ref 11.1–15.9)
Immature Grans (Abs): 0 10*3/uL (ref 0.0–0.1)
Immature Granulocytes: 0 %
Lymphocytes Absolute: 3.1 10*3/uL (ref 0.7–3.1)
Lymphs: 29 %
MCH: 30.1 pg (ref 26.6–33.0)
MCHC: 33.1 g/dL (ref 31.5–35.7)
MCV: 91 fL (ref 79–97)
Monocytes Absolute: 0.7 10*3/uL (ref 0.1–0.9)
Monocytes: 6 %
Neutrophils Absolute: 6.6 10*3/uL (ref 1.4–7.0)
Neutrophils: 64 %
Platelets: 280 10*3/uL (ref 150–450)
RBC: 4.75 x10E6/uL (ref 3.77–5.28)
RDW: 13.3 % (ref 11.7–15.4)
WBC: 10.6 10*3/uL (ref 3.4–10.8)

## 2018-09-14 NOTE — Telephone Encounter (Signed)
-----   Message from Britt Bottom, MD sent at 09/14/2018 12:50 PM EDT ----- Please let the patient know that the lab work is fine.

## 2018-09-24 ENCOUNTER — Other Ambulatory Visit: Payer: Self-pay | Admitting: Neurology

## 2018-09-24 MED FILL — GABAPENTIN 300 MG CAPSULE: 300 | 30 days supply | Qty: 90 | Fill #2

## 2018-09-24 MED FILL — DULOXETINE HCL 60 MG CPEP: 60 | 90 days supply | Qty: 90 | Fill #1

## 2018-09-26 MED FILL — buPROPion HCL ER (XL) 300 M: 300 | 90 days supply | Qty: 90 | Fill #0

## 2018-10-04 MED FILL — buPROPion HCL ER (XL) 300 M: 300 | 90 days supply | Qty: 90 | Fill #0

## 2018-10-10 ENCOUNTER — Telehealth: Payer: No Typology Code available for payment source | Admitting: Women's Health

## 2018-10-10 ENCOUNTER — Encounter: Payer: Self-pay | Admitting: *Deleted

## 2018-10-12 ENCOUNTER — Other Ambulatory Visit: Payer: Self-pay

## 2018-10-12 ENCOUNTER — Ambulatory Visit (INDEPENDENT_AMBULATORY_CARE_PROVIDER_SITE_OTHER): Payer: No Typology Code available for payment source | Admitting: Neurology

## 2018-10-12 DIAGNOSIS — G4710 Hypersomnia, unspecified: Secondary | ICD-10-CM | POA: Diagnosis not present

## 2018-10-12 DIAGNOSIS — R0683 Snoring: Secondary | ICD-10-CM

## 2018-10-12 DIAGNOSIS — G4719 Other hypersomnia: Secondary | ICD-10-CM

## 2018-10-18 ENCOUNTER — Encounter: Payer: Self-pay | Admitting: Obstetrics & Gynecology

## 2018-10-18 ENCOUNTER — Other Ambulatory Visit: Payer: Self-pay

## 2018-10-18 ENCOUNTER — Telehealth (INDEPENDENT_AMBULATORY_CARE_PROVIDER_SITE_OTHER): Payer: No Typology Code available for payment source | Admitting: Obstetrics & Gynecology

## 2018-10-18 VITALS — Ht 68.5 in

## 2018-10-18 DIAGNOSIS — Z30431 Encounter for routine checking of intrauterine contraceptive device: Secondary | ICD-10-CM

## 2018-10-18 NOTE — Progress Notes (Signed)
Patient ID: Jessica [name], female   DOB: Jan 21, 1986, 33 y.o.   MRN: [id number]   TELEHEALTH webex VIRTUAL GYNECOLOGY VISIT ENCOUNTER NOTE  I connected with Sherlynn Obarr on 10/18/18 at  2:30 PM EDT by telephone webex at home and verified that I am speaking with the correct person using two identifiers.   I discussed the limitations, risks, security and privacy concerns of performing an evaluation and management service by telephone and the availability of in person appointments. I also discussed with the patient that there may be a patient responsible charge related to this service. The patient expressed understanding and agreed to proceed.   History:  Jessica [name] is a 33 y.o. G97P1011 female being evaluated today for IUD check. She denies any abnormal vaginal discharge, bleeding, pelvic pain or other concerns.    She can feel the string and is not having any problems noted     Past Medical History:  Diagnosis Date  . Chronic hip pain   . Closed left arm fracture    age [age]  . MS (multiple sclerosis) ([place])   . Scoliosis    Past Surgical History:  Procedure Laterality Date  . adnoids    . CHOLECYSTECTOMY    . NASAL SEPTUM SURGERY    . toe nails removed    . TONSILLECTOMY     The following portions of the patient's history were reviewed and updated as appropriate: allergies, current medications, past family history, past medical history, past social history, past surgical history and problem list.   Health Maintenance:  Normal pap and negative HRHPV on 3/18.  Normal mammogram on n/a.   Review of Systems:  Pertinent items noted in HPI and remainder of comprehensive ROS otherwise negative.  Physical Exam:  Physical exam deferred due to nature of the encounter  Labs and Imaging No results found for this or any previous visit (from the past 336 hour(s)). No results found.     No orders of the defined types were placed in this encounter.   No orders of the defined  types were placed in this encounter.   Assessment and Plan:     IUD check is normal.      I discussed the assessment and treatment plan with the patient. The patient was provided an opportunity to ask questions and all were answered. The patient agreed with the plan and demonstrated an understanding of the instructions.   The patient was advised to call back or seek an in-person evaluation/go to the ED if the symptoms worsen or if the condition fails to improve as anticipated.  I provided 11 minutes of non-face-to-face time during this encounter.   Florian Buff, [place] for [facility]

## 2018-10-23 NOTE — Progress Notes (Signed)
Patient Information  First Name: Jessica Last Name: [name] ID: [id number] Birth Date: 10-02-1985 Age: 33 Gender: Female Insurer:  BMI: 44.4 (W=293 lb, H=5' 8'') Neck Circ.: [place]: 18 Sleep  Study Information   Study Date  Oct 12, 2018    S/H/A Version:   001.001.001.001 / 4.1.1528 / 77  Referring  Physician Information   Richard A. Felecia Shelling, MD, PhD  INTERPRETATION   1.  Mild OSA with an pAHI = 10.0  RECOMMENDATIONS   1.  For her mild OSA and moderately severe excessive daytime sleepiness, consider treatment with CPAP 2.  Weight loss and/or oral appliance may also be beneficial  Richard A. Felecia Shelling, MD, PhD, FAAN Certified in Neurology, Clinical Neurophysiology, Sleep Medicine, Pain Medicine and Neuroimaging Director, [place] [facility] [address], [place], [place] [address] [phone]        Sleep Summary    Start Study Time: 11:55:15 PM  End Study Time: 5:09:28 AM  Total Recording Time:5 hrs, 14 min  Respiratory Indices   Total Events REM NREM All Night  pRDI: 53 18.9 10.2 12.6  pAHI: 42 14.6 8.2 10.0  ODI: 11 4.3 2.0 2.6  pAHIc: 1 0.0 0.3 0.2  % CSR: 0.0      Oxygen Saturation Statistics  Mean: 95 Minimum: 90 Maximum: 100 Mean of Desaturations Nadirs (%): 93  Oxygen Desatur. %:  4-9 10-20 >20 Total  Events Number  11 0 0 11  Total  100.0 0.0 0.0 100.0   Oxygen Saturation:  <90  <=88  <85  <80  <70  Duration (minutes): 0.0 0.0 0.0 0.0 0.0  Sleep % 0.0 0.0 0.0 0.0 0.0    Pulse Rate Statistics during Sleep (BPM)

## 2018-11-14 ENCOUNTER — Other Ambulatory Visit: Payer: Self-pay | Admitting: *Deleted

## 2018-11-14 DIAGNOSIS — G35 Multiple sclerosis: Secondary | ICD-10-CM

## 2018-11-14 MED ORDER — TECFIDERA 240 MG PO CPDR: 240.0000 mg | DELAYED_RELEASE_CAPSULE | Freq: Two times a day (BID) | ORAL | 11 refills | Status: DC

## 2018-12-14 ENCOUNTER — Ambulatory Visit: Payer: No Typology Code available for payment source | Admitting: Neurology

## 2018-12-22 ENCOUNTER — Telehealth: Payer: Self-pay | Admitting: *Deleted

## 2018-12-22 NOTE — Telephone Encounter (Signed)
Faxed completed PA Tecfidera to [employer] at [phone] with OV notes. Received fax confirmation. Waiting on determination. Marked urgent.

## 2018-12-26 NOTE — Telephone Encounter (Signed)
Received fax from [facility] [place] that [place] approved 05/31/2018-05/30/2019. Ref auth#: [id number].

## 2018-12-27 ENCOUNTER — Telehealth: Payer: Self-pay | Admitting: Neurology

## 2018-12-27 NOTE — Telephone Encounter (Signed)
Re-faxed PA form requesting brand name to [facility] at [phone]. Received fax confirmation. Waiting on determination.

## 2018-12-27 NOTE — Telephone Encounter (Signed)
Javier from [facility] called stating that the pt will be needing a new PA for her Tushka even though hers was approved till 05/30/19 a new one is needed now that a generic form of it has been released. Please advise.

## 2019-01-04 NOTE — Telephone Encounter (Signed)
Received request for more info via fax from [facility] for PA brand Tecfidera. Pt has not been on another DMT. She has been on Brand Tecfidera since 04/2017 and tolerates well, stable. Recent MRI brain 04/2018 showed no new lesions. I faxed this information back to them at [phone] asking that Brand Tecfidera be approved so pt can continue this since she is stable. Received fax confirmation, waiting on determination.

## 2019-01-11 NOTE — Telephone Encounter (Signed)
Received fax that PA denied. I submitted urgent appeal letter to [employer] and Grievance at [phone]. Received fax confirmation. Waiting on determination.

## 2019-01-12 NOTE — Addendum Note (Signed)
Addended by: Hope Pigeon on: 01/12/2019 02:29 PM   Modules accepted: Orders

## 2019-01-12 NOTE — Telephone Encounter (Signed)
Pt stopped by office and signed Vumerity start form. I provided her pt education for this.  Also went over sleep study results. She would like to try weight loss first before trying autopap. She will call back if she changes her mind/feels weight loss ineffective.   Faxed completed/signed Vumerity start form to [facility] at [phone]. Received fax confirmation.

## 2019-01-12 NOTE — Telephone Encounter (Signed)
Called and spoke with pt. Relayed insurance will no longer cover brand name Tecfidera until she has tried/failed at least two of the generics available. Explained we can send in rx for generic to see what cost would be. Or, we can switch her to Vumerity which is similar to Tecfidera (has less GI SE). Pt would like to switch to Vumerity. She will come to sign start form today around 1:30/2pm. She has quite a bit of brand Tecfidera left d/t forgetting to take her morning doses.   She also had home sleep study done 10/12/2018. She has not heard about results yet. I advised I will speak with Dr. Felecia Shelling and let her know about this. She verbalized understanding.  I spoke with Dr. Felecia Shelling. He was able to pull up report. He recommends starting on autopap 5-15 or she can try weight loss/oral appliance. I will discuss with pt.

## 2019-01-16 MED FILL — buPROPion HCL ER (XL) 300 M: 300 | 90 days supply | Qty: 90 | Fill #1

## 2019-01-16 MED FILL — SOLIFENACIN SUCCINATE 10 MG: 10 | 90 days supply | Qty: 90 | Fill #1

## 2019-01-17 MED FILL — GABAPENTIN 300 MG CAPSULE: 300 | 30 days supply | Qty: 90 | Fill #3

## 2019-01-17 MED FILL — DULOXETINE HCL 60 MG CPEP: 60 | 90 days supply | Qty: 90 | Fill #2

## 2019-01-18 ENCOUNTER — Telehealth: Payer: Self-pay | Admitting: Neurology

## 2019-01-18 NOTE — Telephone Encounter (Signed)
Jessica [name] with [employer] called to inform that the appeal for the texadera 40mg  was denied however, the Generic version is approved by the plan and wanted to confirm if It's okay to dispense the generic kind. Please follow up.

## 2019-01-18 NOTE — Telephone Encounter (Signed)
Okay for generic

## 2019-01-19 NOTE — Telephone Encounter (Signed)
I contacted [employer] and advised via vm ok to dispense generic texadera 40 mg. Left # as a call back if need be.

## 2019-01-24 NOTE — Telephone Encounter (Signed)
[facility] (CB# [phone]) called in regards to the vumerity states that they are unable to fill this medication for the patient and that it needs to be sent to  [facility] health outpatient pharmacy  [phone]

## 2019-01-24 NOTE — Telephone Encounter (Signed)
I called and spoke to the patient.  She has not started Vumerity yet.  States she has to call [place] back about the free drug program.

## 2019-02-02 ENCOUNTER — Telehealth: Payer: Self-pay | Admitting: *Deleted

## 2019-02-02 NOTE — Telephone Encounter (Signed)
Received fax from [employer] that PA aaproved for a max of 12 fills from 02/02/19-02/01/20. She has to fill prescription at [facility]. Phone: [phone]. PA ref # [id number]. Relayed approval info to Angie with [employer].

## 2019-02-02 NOTE — Telephone Encounter (Signed)
Submitted PA Vumerity on CMM. Key: [id number]. Waiting on determination from [employer].

## 2019-02-14 ENCOUNTER — Telehealth: Payer: Self-pay | Admitting: *Deleted

## 2019-02-14 NOTE — Telephone Encounter (Signed)
Received fax from [facility] that [facility] has confirmed that pt's vumerity has been shipped. Phone: [phone]. Fax: [phone].

## 2019-03-13 DIAGNOSIS — Z0289 Encounter for other administrative examinations: Secondary | ICD-10-CM

## 2019-03-16 ENCOUNTER — Ambulatory Visit: Payer: No Typology Code available for payment source | Admitting: Neurology

## 2019-03-16 ENCOUNTER — Other Ambulatory Visit: Payer: Self-pay

## 2019-03-16 ENCOUNTER — Encounter: Payer: Self-pay | Admitting: Neurology

## 2019-03-16 VITALS — BP 122/73 | HR 71 | Temp 97.4°F | Ht 68.5 in | Wt 294.5 lb

## 2019-03-16 DIAGNOSIS — G35 Multiple sclerosis: Secondary | ICD-10-CM

## 2019-03-16 DIAGNOSIS — G4733 Obstructive sleep apnea (adult) (pediatric): Secondary | ICD-10-CM | POA: Diagnosis not present

## 2019-03-16 DIAGNOSIS — R3915 Urgency of urination: Secondary | ICD-10-CM

## 2019-03-16 DIAGNOSIS — Z79899 Other long term (current) drug therapy: Secondary | ICD-10-CM | POA: Diagnosis not present

## 2019-03-16 DIAGNOSIS — G4719 Other hypersomnia: Secondary | ICD-10-CM

## 2019-03-16 DIAGNOSIS — F32A Depression, unspecified: Secondary | ICD-10-CM

## 2019-03-16 DIAGNOSIS — F329 Major depressive disorder, single episode, unspecified: Secondary | ICD-10-CM

## 2019-03-16 MED ORDER — DULOXETINE HCL 60 MG PO CPEP: 60.0000 mg | ORAL_CAPSULE | Freq: Every day | ORAL | 3 refills | Status: DC

## 2019-03-16 MED ORDER — MIRABEGRON ER 50 MG PO TB24: 50.0000 mg | ORAL_TABLET | Freq: Every day | ORAL | 5 refills | Status: DC

## 2019-03-16 MED ORDER — GABAPENTIN 300 MG PO CAPS: 300.0000 mg | ORAL_CAPSULE | Freq: Three times a day (TID) | ORAL | 3 refills | Status: DC

## 2019-03-16 MED FILL — GABAPENTIN 300 MG CAPSULE: 300 | 90 days supply | Qty: 270 | Fill #0

## 2019-03-16 NOTE — Progress Notes (Signed)
[facility]  PATIENT: Jessica [name] DOB: Jan 09, 1986  REFERRING DOCTOR OR PCP:  Dr. Jaynee Eagles.   PCP is Pharmacologist SOURCE: Patient, notes from Dr. Jaynee Eagles, imaging and lab results, MRI images on PACS personally reviewed.  _________________________________   HISTORICAL  CHIEF COMPLAINT:  Chief Complaint  Patient presents with  . Follow-up    RM 12, alone. Last seen 09/13/2018. Sunday/Monday right arm started to hurt like she got a shot and has numb.   . Multiple Sclerosis    On Vumerity.     HISTORY OF PRESENT ILLNESS:  Jessica [name] is a 34 y.o. woman with multiple sclerosis.  Update 03/16/2019: She is on Vumerity and tolerates it well.   She has no exacerbations and denies any new MS symptoms.   Her gait is doing about the same.  She has some paresthesias in the legs.    She had a shock-like sensation with numbness down the right arm on Sunday.    The numbness is gone and pain is better.     She did not have neck pain at the time.    She had no arm weakness.   She does not muscles fatigue easily and she feels overall weak.     She has more urinary urgency and has had rare urge incontinence.   She is not sure she gets much benefit from solifenacin.    She notes fatigue and sleepiness.     Home Sleep study 10/12/2018 showed mild OSA with AHI = 10.   She has some excessive daytime sleepiness (ESS was 14).  We discussed options.   She has depression, helped by Wellbutrin and duloxetine.   We discussed the vaccine.  I believe the [place] and Moderna vaccines should be fine for MS patients.   She works at [employer].  She gets  Update 09/13/2018: She is on Tecfidera and generally tolerates it well.   She was having some issues with copay assistance but that has been sorted out.    She walks one mile in the mornings.   No falls or stumbles.  She only holds the bannister if she has knee pain.    She denies any weakness or numbness.     She has urinary  urgency and rare urge incontinence.   Oxybutynin did not help much.  Vision is fine.  She feels her fatigue is doing worse.    She is more tired and sleepy.   She feels wiped out even with minimal activity.   She sleeps 12+ hours most days (14-15 on days off).   She snores but has never been told she has OSA signs.    She used to snore worse before ENT surgery 13-14 years ago.    She notes less depression since starting Cymbalta and is still on Wellbutrin. . Cognition is doing well.  MRI Brain 04/05/2018 IMPRESSION: This MRI of the brain with and without contrast shows the following: 1.    Multiple T2/flair hyperintense foci in the hemispheres and pons in a pattern and configuration consistent with chronic demyelinating plaque associated with multiple sclerosis.  None of the foci enhance or appear to be acute.  The large left middle cerebellar peduncle focus present on the 04/28/2017 MRI is much less apparent on the current.  There were no new lesions. 2.    No acute findings and a normal enhancement pattern.  EPWORTH SLEEPINESS SCALE (09/13/2018)  On a scale of 0 - 3 what is the  chance of dozing:  Sitting and Reading:   3 Watching TV:    3 Sitting inactive in a public place: 2 Passenger in car for one hour: 0  (never a passenger) Lying down to rest in the afternoon: 3 Sitting and talking to someone: 0 Sitting quietly after lunch:  3 In a car, stopped in traffic:  0  Total (out of 24):   14/24 (mild to moderate excessive sleepiness)   Update 03/15/2018: She is on Tecfidera for her MS.   She generally tolerates it well.   No GI upset or flushing.   No exacerbation.    Her gait is about the same.   Her legs sometimes feel weak.   She gets a pain sensation in her legs now/then lasting minutes to the entire day.  Bladder is doing well on oxybutynin.    She has 1 x nocturia.    She is having mood swings.   She gets frustrated easily.    She goes from excited to upset very quickly at times.   She is  sometimes mildly agitated.    She went on Wellbutrin with some benefit.  She quit smoking but went back. Memory seems worse.   She notes fatigue many afternoons.   She often falls asleep on the couch.   She snores but husband has not noted  OSA (only once has he ever felt she had a long pause).  No snorts or gasps.   She is getting 7 - 8 hours sleep most nights.    Vision is ok but she notes mild sensitivity.  She has pain in her legs.    She has more headaches over the eyes.  No nausea or vomiting.    She is having them about twice a week.   Tylenol or OTC NSAIDs have not helped.     Initially, imipramine helped a lot but less so now.   She has an increased appetite and has gained 10 pounds in last 6 months.     She notes that her voice modulates some while she is talking, especially if talking longer.     Swallowing is ok.   She has a mild dry mouth from her medication.   However, she feels the voice changes are unrelated to dry mouth.      Update 09/08/2017: She was diagnosed with MS earlier this year.  She denies recent MS exacerbation. She has been on the Tecfidera for nearly 4 months,   She is tolerating it well.   She felt lethargic on it the first couple weeks.   She had flushing just a few times and never had GI symptoms.  Gait and balance are doing well.   She could climb a ladder.    Strength and sensation are fine. Facial numbness has resolved.    Bladder function is fine on oxybutynin   Vision is fine.     She feels her fatigue is mild and manageable.    She sleeps well most night.   She is having some mood swings the last month or two.     SHe is easily irritated.    Cognition is fine.     She is noting headaches that are almost daily 28/30 days/month.  The worse pain is in the left forehead and periorbital region.    Ibuprofen [phone] sometimes helps but usually the pain returns in a few hours.   She denies neck pain.   She has photophobia.  No nausea.  She used to get headahces x 6-7 days  in the past without nausea and was told she had migraines.     From 05/04/2017: In March 2015, she had an episode of bilateral leg numbness and right arm numbness and MRI was recommended but she signed out against medical device from the emergency room.  She was numb from the waist down and had a Lhermitte sign.    This lasted 2 months and completely resolved.   She did not follow-up with neurology.   In mid February 2019, she had the onset of left facial dysesthesia followed by numbness including the tongue she also had some trouble swallowing. She had a scalded sensation in the tongue.    She feels she is about the same.   She feels slightly off with walking but balance is not bad.   She is able to climb stairs without problems.     Currently, gait, strength, sensation (outside face) ok.   She has urinary frequency x several months and she has some leakage.   She had one episode of bowel incontinence.  She has fatigue, physical > cognitive.   Sometimes she feels sleepy..   She sleeps well at night.   She had depression in the past and was on a medication but stopped and feels better now.     She has more trouble staying focused than she used to.     I personally reviewed the MRI of the brain dated 04/28/2017. It is abnormal showing multiple T2/flair hyperintense foci in the periventricular, juxtacortical and deep white matter of both hemispheres, with many of the periventricular foci are radially oriented to the ventricles. She also had a focus in the left middle cerebellar peduncle. None of the foci enhanced.    On the thoracic spine images from 3 years ago, there is a possible T2 level cord focus but study is degraded by movement and interpretation is fairly difficult.    Lab work shows a negative ANA, TSH, CBC and CMP.    REVIEW OF SYSTEMS: Constitutional: No fevers, chills, sweats, or change in appetite Eyes: No visual changes, double vision, eye pain Ear, nose and throat: No hearing loss, ear  pain, nasal congestion, sore throat Cardiovascular: No chest pain, palpitations Respiratory: No shortness of breath at rest or with exertion.   No wheezes GastrointestinaI: No nausea, vomiting, diarrhea, abdominal pain, fecal incontinence Genitourinary: No dysuria, urinary retention or frequency.  No nocturia. Musculoskeletal: No neck pain, back pain Integumentary: No rash, pruritus, skin lesions Neurological: as above Psychiatric: No depression at this time.  No anxiety Endocrine: No palpitations, diaphoresis, change in appetite, change in weigh or increased thirst Hematologic/Lymphatic: No anemia, purpura, petechiae. Allergic/Immunologic: No itchy/runny eyes, nasal congestion, recent allergic reactions, rashes  ALLERGIES: No Known Allergies  HOME MEDICATIONS:  Current Outpatient Medications:  .  buPROPion (WELLBUTRIN XL) 300 MG 24 hr tablet, TAKE 1 TABLET (300 MG TOTAL) BY MOUTH DAILY., Disp: 90 tablet, Rfl: 3 .  cetirizine (ZYRTEC) 10 MG tablet, Take 10 mg by mouth daily., Disp: , Rfl:  .  Diroximel Fumarate, Starter, (VUMERITY, STARTER,) 231 MG CPDR, Take by mouth., Disp: , Rfl:  .  DULoxetine (CYMBALTA) 60 MG capsule, Take 1 capsule (60 mg total) by mouth daily., Disp: 90 capsule, Rfl: 3 .  gabapentin (NEURONTIN) 300 MG capsule, Take 1 capsule (300 mg total) by mouth 3 (three) times daily., Disp: 270 capsule, Rfl: 3 .  ibuprofen (ADVIL,MOTRIN) 200 MG tablet, Take 400 mg by  mouth daily as needed. , Disp: , Rfl:  .  levonorgestrel (MIRENA) 20 MCG/24HR IUD, 1 each by Intrauterine route once.  , Disp: , Rfl:  .  mirabegron ER (MYRBETRIQ) 50 MG TB24 tablet, Take 1 tablet (50 mg total) by mouth daily., Disp: 30 tablet, Rfl: 5 .  solifenacin (VESICARE) 10 MG tablet, Take 1 tablet (10 mg total) by mouth daily., Disp: 90 tablet, Rfl: 3 .  VITAMIN D PO, Take by mouth daily., Disp: , Rfl:  No current facility-administered medications for this visit.  Facility-Administered Medications  Ordered in Other Visits:  .  gadopentetate dimeglumine (MAGNEVIST) injection 20 mL, 20 mL, Intravenous, Once PRN, Anson Fret, MD  PAST MEDICAL HISTORY: Past Medical History:  Diagnosis Date  . Chronic hip pain   . Closed left arm fracture    age [age]  . MS (multiple sclerosis) (HCC)   . Scoliosis     PAST SURGICAL HISTORY: Past Surgical History:  Procedure Laterality Date  . adnoids    . CHOLECYSTECTOMY    . NASAL SEPTUM SURGERY    . toe nails removed    . TONSILLECTOMY      FAMILY HISTORY: Family History  Problem Relation Age of Onset  . Hypertension Mother   . Diabetes Mother        prediabetic  . COPD Mother   . Diabetes Paternal Grandfather   . Hypertension Maternal Grandmother   . Other Maternal Grandmother   . Addison's disease Maternal Grandmother   . Cancer Maternal Grandfather     SOCIAL HISTORY:  Social History   Socioeconomic History  . Marital status: Married    Spouse name: Not on file  . Number of children: Not on file  . Years of education: Not on file  . Highest education level: Not on file  Occupational History  . Not on file  Tobacco Use  . Smoking status: Current Every Day Smoker    Packs/day: 1.00    Years: 13.00    Pack years: 13.00    Types: Cigarettes  . Smokeless tobacco: Never Used  Substance and Sexual Activity  . Alcohol use: Not Currently  . Drug use: Yes    Types: Marijuana    Comment: occ  . Sexual activity: Yes    Birth control/protection: I.U.D.  Other Topics Concern  . Not on file  Social History Narrative  . Not on file   Social Determinants of Health   Financial Resource Strain:   . Difficulty of Paying Living Expenses: Not on file  Food Insecurity:   . Worried About Programme researcher, broadcasting/film/video in the Last Year: Not on file  . Ran Out of Food in the Last Year: Not on file  Transportation Needs:   . Lack of Transportation (Medical): Not on file  . Lack of Transportation (Non-Medical): Not on file  Physical  Activity:   . Days of Exercise per Week: Not on file  . Minutes of Exercise per Session: Not on file  Stress:   . Feeling of Stress : Not on file  Social Connections:   . Frequency of Communication with Friends and Family: Not on file  . Frequency of Social Gatherings with Friends and Family: Not on file  . Attends Religious Services: Not on file  . Active Member of Clubs or Organizations: Not on file  . Attends Banker Meetings: Not on file  . Marital Status: Not on file  Intimate Partner Violence:   . Fear  of Current or Ex-Partner: Not on file  . Emotionally Abused: Not on file  . Physically Abused: Not on file  . Sexually Abused: Not on file     PHYSICAL EXAM  Vitals:   03/16/19 1425  BP: 122/73  Pulse: 71  Temp: (!) 97.4 F (36.3 C)  Weight: 294 lb 8 oz (133.6 kg)  Height: 5' 8.5" (1.74 m)    Body mass index is 44.13 kg/m.   General: The patient is well-developed and well-nourished and in no acute distress.   Sclera are anicteric.   Neck with good ROM   Neurologic Exam  Mental status: The patient is alert and oriented x 3 at the time of the examination. The patient has apparent normal recent and remote memory, with an apparently normal attention span and concentration ability.   Speech is normal.  Cranial nerves: Extraocular movements are full.  The pupils react to light and accommodation.  Color vision is symmetric.  Facial strength and sensation is normal.  Trapezius strength is normal.  No dysarthria is noted.  The tongue is midline, and the patient has symmetric elevation of the soft palate. No obvious hearing deficits are noted.  Motor:  Muscle bulk is normal.   Tone is normal. Strength is  5 / 5 in all 4 extremities. Rapid alternating movements are performed normally  Sensory: Sensory testing is intact to pinprick, soft touch and vibration sensation in all 4 extremities.  Coordination: Cerebellar testing reveals good finger-nose-finger and  heel-to-shin is now normal and symmetric  Gait and station: Station is normal.   Gait and tandem gait are normal. Romberg is negative.   Reflexes: Deep tendon reflexes are symmetric and normal bilaterally in the arms and increase at the knees with spread    There is no ankle clonus.    DIAGNOSTIC DATA (LABS, IMAGING, TESTING) - I reviewed patient records, labs, notes, testing and imaging myself where available.  Lab Results  Component Value Date   WBC 10.6 09/13/2018   HGB 14.3 09/13/2018   HCT 43.2 09/13/2018   MCV 91 09/13/2018   PLT 280 09/13/2018      Component Value Date/Time   NA 138 03/15/2018 1544   K 4.9 03/15/2018 1544   CL 102 03/15/2018 1544   CO2 24 03/15/2018 1544   GLUCOSE 75 03/15/2018 1544   GLUCOSE 94 03/21/2014 0628   BUN 17 03/15/2018 1544   CREATININE 0.72 03/15/2018 1544   CALCIUM 9.3 03/15/2018 1544   PROT 6.4 03/15/2018 1544   ALBUMIN 4.1 03/15/2018 1544   AST 12 03/15/2018 1544   ALT 14 03/15/2018 1544   ALKPHOS 47 03/15/2018 1544   BILITOT <0.2 03/15/2018 1544   GFRNONAA 111 03/15/2018 1544   GFRAA 128 03/15/2018 1544   Lab Results  Component Value Date   CHOL 166 10/29/2009   HDL 37 (L) 10/29/2009   LDLCALC 103 (H) 10/29/2009   TRIG 129 10/29/2009   CHOLHDL 4.5 Ratio 10/29/2009   No results found for: HGBA1C No results found for: VITAMINB12 Lab Results  Component Value Date   TSH 1.930 04/23/2017       ASSESSMENT AND PLAN  1. Multiple sclerosis (HCC)   2. High risk medication use   3. Urinary urgency   4. OSA (obstructive sleep apnea)   5. Depression, unspecified depression type   6. Excessive daytime sleepiness     1.     Continue Vumerity check CBC with differential 2.     Continue Wellbutrin  Xl and Cymbalta for depression.   Renew Cymblta 3.     Add Myrbetriq to [employer] for urinary dysfunction 4.     She has mild OSA.  We discussed options including CPAP and oral appliance.   She does not think she would use CPAP  consistently but is interested in an oral appliance.  I gave her a name of a [place] dentist who can make these. 5.     Return in 6 months or sooner if there are new or worsening neurologic symptoms.   Siaosi Alter A. Epimenio Foot, MD, [facility] 03/16/2019, 3:07 PM Certified in Neurology, Clinical Neurophysiology, Sleep Medicine, Pain Medicine and Neuroimaging  [facility] [address] [place], [place] [address] [phone]

## 2019-03-17 LAB — CBC WITH DIFFERENTIAL/PLATELET
Basophils Absolute: 0 10*3/uL (ref 0.0–0.2)
Basos: 0 %
EOS (ABSOLUTE): 0.1 10*3/uL (ref 0.0–0.4)
Eos: 1 %
Hematocrit: 44.1 % (ref 34.0–46.6)
Hemoglobin: 14.8 g/dL (ref 11.1–15.9)
Immature Grans (Abs): 0 10*3/uL (ref 0.0–0.1)
Immature Granulocytes: 0 %
Lymphocytes Absolute: 2.9 10*3/uL (ref 0.7–3.1)
Lymphs: 24 %
MCH: 30.1 pg (ref 26.6–33.0)
MCHC: 33.6 g/dL (ref 31.5–35.7)
MCV: 90 fL (ref 79–97)
Monocytes Absolute: 0.7 10*3/uL (ref 0.1–0.9)
Monocytes: 6 %
Neutrophils Absolute: 8 10*3/uL — ABNORMAL HIGH (ref 1.4–7.0)
Neutrophils: 69 %
Platelets: 292 10*3/uL (ref 150–450)
RBC: 4.92 x10E6/uL (ref 3.77–5.28)
RDW: 12.7 % (ref 11.7–15.4)
WBC: 11.7 10*3/uL — ABNORMAL HIGH (ref 3.4–10.8)

## 2019-03-20 ENCOUNTER — Telehealth: Payer: Self-pay | Admitting: *Deleted

## 2019-03-20 NOTE — Telephone Encounter (Signed)
-----   Message from Asa Lente, MD sent at 03/17/2019 11:27 AM EST ----- Please let the patient know that the lab work is fine.

## 2019-03-22 ENCOUNTER — Telehealth: Payer: Self-pay | Admitting: *Deleted

## 2019-03-22 NOTE — Telephone Encounter (Signed)
Gave completed/signed FMLA to medical records to process for pt. 

## 2019-03-30 ENCOUNTER — Telehealth: Payer: Self-pay | Admitting: *Deleted

## 2019-03-30 NOTE — Telephone Encounter (Signed)
Sent mychart message

## 2019-04-25 ENCOUNTER — Other Ambulatory Visit: Payer: Self-pay

## 2019-04-25 ENCOUNTER — Ambulatory Visit: Payer: No Typology Code available for payment source

## 2019-04-25 DIAGNOSIS — G35 Multiple sclerosis: Secondary | ICD-10-CM | POA: Diagnosis not present

## 2019-04-25 MED ORDER — GADOBENATE DIMEGLUMINE 529 MG/ML IV SOLN
20.0000 mL | Freq: Once | INTRAVENOUS | Status: AC | PRN
  Administered 2019-04-25: 20 mL via INTRAVENOUS

## 2019-06-29 ENCOUNTER — Ambulatory Visit
Admission: EM | Admit: 2019-06-29 | Discharge: 2019-06-29 | Disposition: A | Discharge status: Home / Self Care | Payer: No Typology Code available for payment source | Attending: Emergency Medicine | Admitting: Emergency Medicine

## 2019-06-29 ENCOUNTER — Other Ambulatory Visit: Payer: Self-pay

## 2019-06-29 ENCOUNTER — Encounter: Payer: Self-pay | Admitting: Emergency Medicine

## 2019-06-29 DIAGNOSIS — S0096XA Insect bite (nonvenomous) of unspecified part of head, initial encounter: Secondary | ICD-10-CM

## 2019-06-29 DIAGNOSIS — W57XXXA Bitten or stung by nonvenomous insect and other nonvenomous arthropods, initial encounter: Secondary | ICD-10-CM

## 2019-06-29 DIAGNOSIS — R220 Localized swelling, mass and lump, head: Secondary | ICD-10-CM

## 2019-06-29 MED ORDER — CEPHALEXIN 500 MG PO CAPS: 500.0000 mg | ORAL_CAPSULE | Freq: Four times a day (QID) | ORAL | 0 refills | Status: DC

## 2019-06-29 NOTE — ED Triage Notes (Signed)
Onset Tuesday of right sided facial pain.  Since then, an area of fullness to right cheek has surfaced, area has scabbing, surrounded by redness and swelling and pain.

## 2019-06-29 NOTE — Discharge Instructions (Addendum)
Keflex was prescribed/take as directed and to completion Follow-up with PCP May take OTC Tylenol/ibuprofen as needed for pain Return or go to ED for worsening of symptoms

## 2019-06-29 NOTE — ED Provider Notes (Signed)
[facility]    CSN: [id number] Arrival date & time: 06/29/19  1521      History   Chief Complaint Chief Complaint  Patient presents with  . Insect Bite    HPI Jessica [name] is a 34 y.o. female.   Presented to the urgent care for complaint of right-sided facial swelling and redness for the past 2 days.  Denies any precipitating event but suspected insect bite.  Localizes the bite to her face.  Has not tried any medication.  Denies previous hx of insect bite.  Denies fever, chills, nausea, vomiting, headache, dizziness, weakness, fatigue, rash, or abdominal pain.   The history is provided by the patient. No language interpreter was used.    Past Medical History:  Diagnosis Date  . Chronic hip pain   . Closed left arm fracture    age [age]  . MS (multiple sclerosis) (HCC)   . Scoliosis     Patient Active Problem List   Diagnosis Date Noted  . BV (bacterial vaginosis) 12/23/2017  . Encounter for IUD insertion 12/23/2017  . Depression 09/08/2017  . Encounter for smoking cessation counseling 09/08/2017  . Multiple sclerosis (HCC) 05/04/2017  . Facial numbness 05/04/2017  . Transverse myelitis (HCC) 05/04/2017  . Ataxia 05/04/2017  . Urinary urgency 05/04/2017  . Neurosensory deficit 05/09/2013  . Leg weakness, bilateral 05/09/2013  . OBESITY, MORBID 10/22/2009  . ULNAR NEUROPATHY 10/22/2009  . CHOLELITHIASIS 10/22/2009    Past Surgical History:  Procedure Laterality Date  . adnoids    . CHOLECYSTECTOMY    . NASAL SEPTUM SURGERY    . toe nails removed    . TONSILLECTOMY      OB History    Gravida  2   Para  1   Term  1   Preterm      AB  1   Living  1     SAB  1   TAB      Ectopic      Multiple      Live Births  1            Home Medications    Prior to Admission medications   Medication Sig Start Date End Date Taking? Authorizing Provider  buPROPion (WELLBUTRIN XL) 300 MG 24 hr tablet TAKE 1 TABLET (300 MG TOTAL)  BY MOUTH DAILY. 09/26/18   Sater, Pearletha Furl, MD  cephALEXin (KEFLEX) 500 MG capsule Take 1 capsule (500 mg total) by mouth 4 (four) times daily. 06/29/19   Lamaria Hildebrandt, Zachery Dakins, FNP  cetirizine (ZYRTEC) 10 MG tablet Take 10 mg by mouth daily.    [provider]  Diroximel Fumarate, Starter, (VUMERITY, STARTER,) 231 MG CPDR Take by mouth.    [provider]  DULoxetine (CYMBALTA) 60 MG capsule Take 1 capsule (60 mg total) by mouth daily. 03/16/19   Sater, Pearletha Furl, MD  gabapentin (NEURONTIN) 300 MG capsule Take 1 capsule (300 mg total) by mouth 3 (three) times daily. 03/16/19   Sater, Pearletha Furl, MD  ibuprofen (ADVIL,MOTRIN) 200 MG tablet Take 400 mg by mouth daily as needed.     [provider]  levonorgestrel (MIRENA) 20 MCG/24HR IUD 1 each by Intrauterine route once.      [provider]  mirabegron ER (MYRBETRIQ) 50 MG TB24 tablet Take 1 tablet (50 mg total) by mouth daily. 03/16/19   Sater, Pearletha Furl, MD  solifenacin (VESICARE) 10 MG tablet Take 1 tablet (10 mg total) by mouth daily.  09/13/18   Sater, Pearletha Furl, MD  VITAMIN D PO Take by mouth daily.    [provider]    Family History Family History  Problem Relation Age of Onset  . Hypertension Mother   . Diabetes Mother        prediabetic  . COPD Mother   . Diabetes Paternal Grandfather   . Hypertension Maternal Grandmother   . Other Maternal Grandmother   . Addison's disease Maternal Grandmother   . Cancer Maternal Grandfather     Social History Social History   Tobacco Use  . Smoking status: Current Every Day Smoker    Packs/day: 1.00    Years: 13.00    Pack years: 13.00    Types: Cigarettes  . Smokeless tobacco: Never Used  Substance Use Topics  . Alcohol use: Not Currently  . Drug use: Yes    Types: Marijuana    Comment: occ     Allergies   Patient has no known allergies.   Review of Systems Review of Systems  Constitutional: Negative.   Respiratory: Negative.     Cardiovascular: Negative.   Skin: Positive for color change.  All other systems reviewed and are negative.    Physical Exam Triage Vital Signs ED Triage Vitals [06/29/19 1541]  Enc Vitals Group     BP      Pulse      Resp      Temp      Temp src      SpO2      Weight      Height      Head Circumference      Peak Flow      Pain Score 4     Pain Loc      Pain Edu?      Excl. in GC?    No data found.  Updated Vital Signs There were no vitals taken for this visit.  Visual Acuity Right Eye Distance:   Left Eye Distance:   Bilateral Distance:    Right Eye Near:   Left Eye Near:    Bilateral Near:     Physical Exam Vitals and nursing note reviewed.  Constitutional:      General: She is not in acute distress.    Appearance: Normal appearance. She is normal weight. She is not ill-appearing, toxic-appearing or diaphoretic.  Cardiovascular:     Rate and Rhythm: Normal rate and regular rhythm.     Pulses: Normal pulses.     Heart sounds: Normal heart sounds. No murmur. No friction rub. No gallop.   Pulmonary:     Effort: Pulmonary effort is normal. No respiratory distress.     Breath sounds: Normal breath sounds. No stridor. No wheezing, rhonchi or rales.  Chest:     Chest wall: No tenderness.  Skin:    General: Skin is warm.     Capillary Refill: Capillary refill takes less than 2 seconds.     Findings: Erythema present.          Comments: Erythema, swelling and pain present at site of insect bite.  Neurological:     Mental Status: She is alert.      UC Treatments / Results  Labs (all labs ordered are listed, but only abnormal results are displayed) Labs Reviewed - No data to display  EKG   Radiology No results found.  Procedures Procedures (including critical care time)  Medications Ordered in UC Medications - No data to display  Initial  Impression / Assessment and Plan / UC Course  I have reviewed the triage vital signs and the nursing  notes.  Pertinent labs & imaging results that were available during my care of the patient were reviewed by me and considered in my medical decision making (see chart for details).    Patient is stable for discharge.  There is a concern about skin cellulitis.  We will prescribe Keflex.  Was advised to take antibiotics prescribed and to completion.  To follow-up with PCP.  Final Clinical Impressions(s) / UC Diagnoses   Final diagnoses:  Insect bite of other part of head, initial encounter  Facial swelling     Discharge Instructions     Keflex was prescribed/take as directed and to completion Follow-up with PCP May take OTC Tylenol/ibuprofen as needed for pain Return or go to ED for worsening of symptoms    ED Prescriptions    Medication Sig Dispense Auth. Provider   cephALEXin (KEFLEX) 500 MG capsule Take 1 capsule (500 mg total) by mouth 4 (four) times daily. 20 capsule Lynise Porr, Darrelyn Hillock, FNP     PDMP not reviewed this encounter.   Emerson Monte, [place] 06/29/19 1617

## 2019-07-03 ENCOUNTER — Other Ambulatory Visit: Payer: Self-pay | Admitting: *Deleted

## 2019-07-03 MED ORDER — DULOXETINE HCL 60 MG PO CPEP: 60.0000 mg | ORAL_CAPSULE | Freq: Every day | ORAL | 3 refills | Status: DC

## 2019-07-03 MED FILL — DULOXETINE HCL 60 MG CPEP: 60 | 90 days supply | Qty: 90 | Fill #0

## 2019-07-03 MED FILL — GABAPENTIN 300 MG CAPSULE: 300 | 90 days supply | Qty: 270 | Fill #1

## 2019-07-03 MED FILL — BUPROPION HCL ER (XL) 300 M: 300 | 90 days supply | Qty: 90 | Fill #2

## 2019-07-03 MED FILL — SOLIFENACIN SUCCINATE 10 MG: 10 | 90 days supply | Qty: 90 | Fill #2

## 2019-07-14 IMAGING — DX DG CHEST 2V
2 series · 2 of 2 positions shown · non-contrast
Comparison: None.

CLINICAL DATA: Cough and short of breath

EXAM:
CHEST - 2 VIEW

[chest pa]
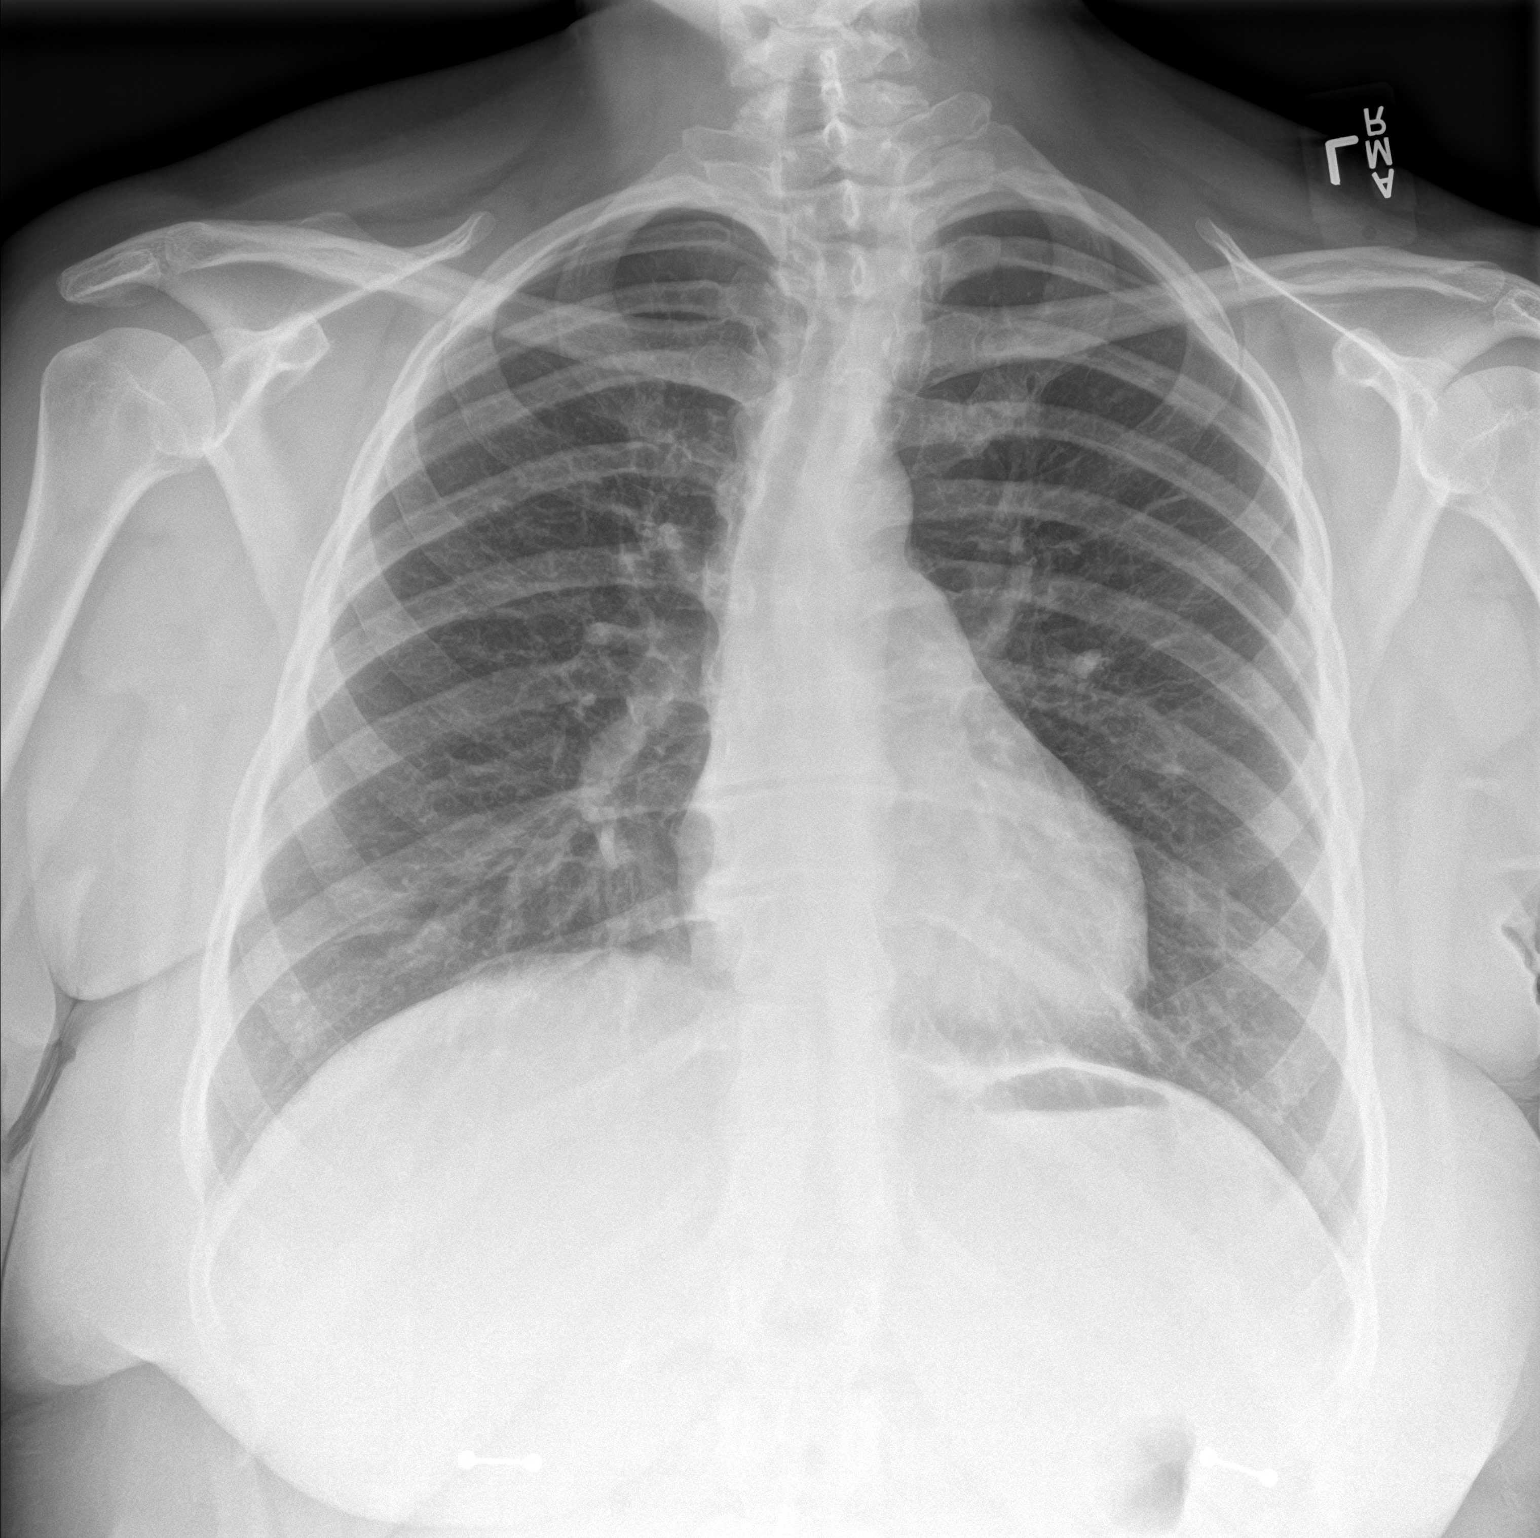

[chest lat]
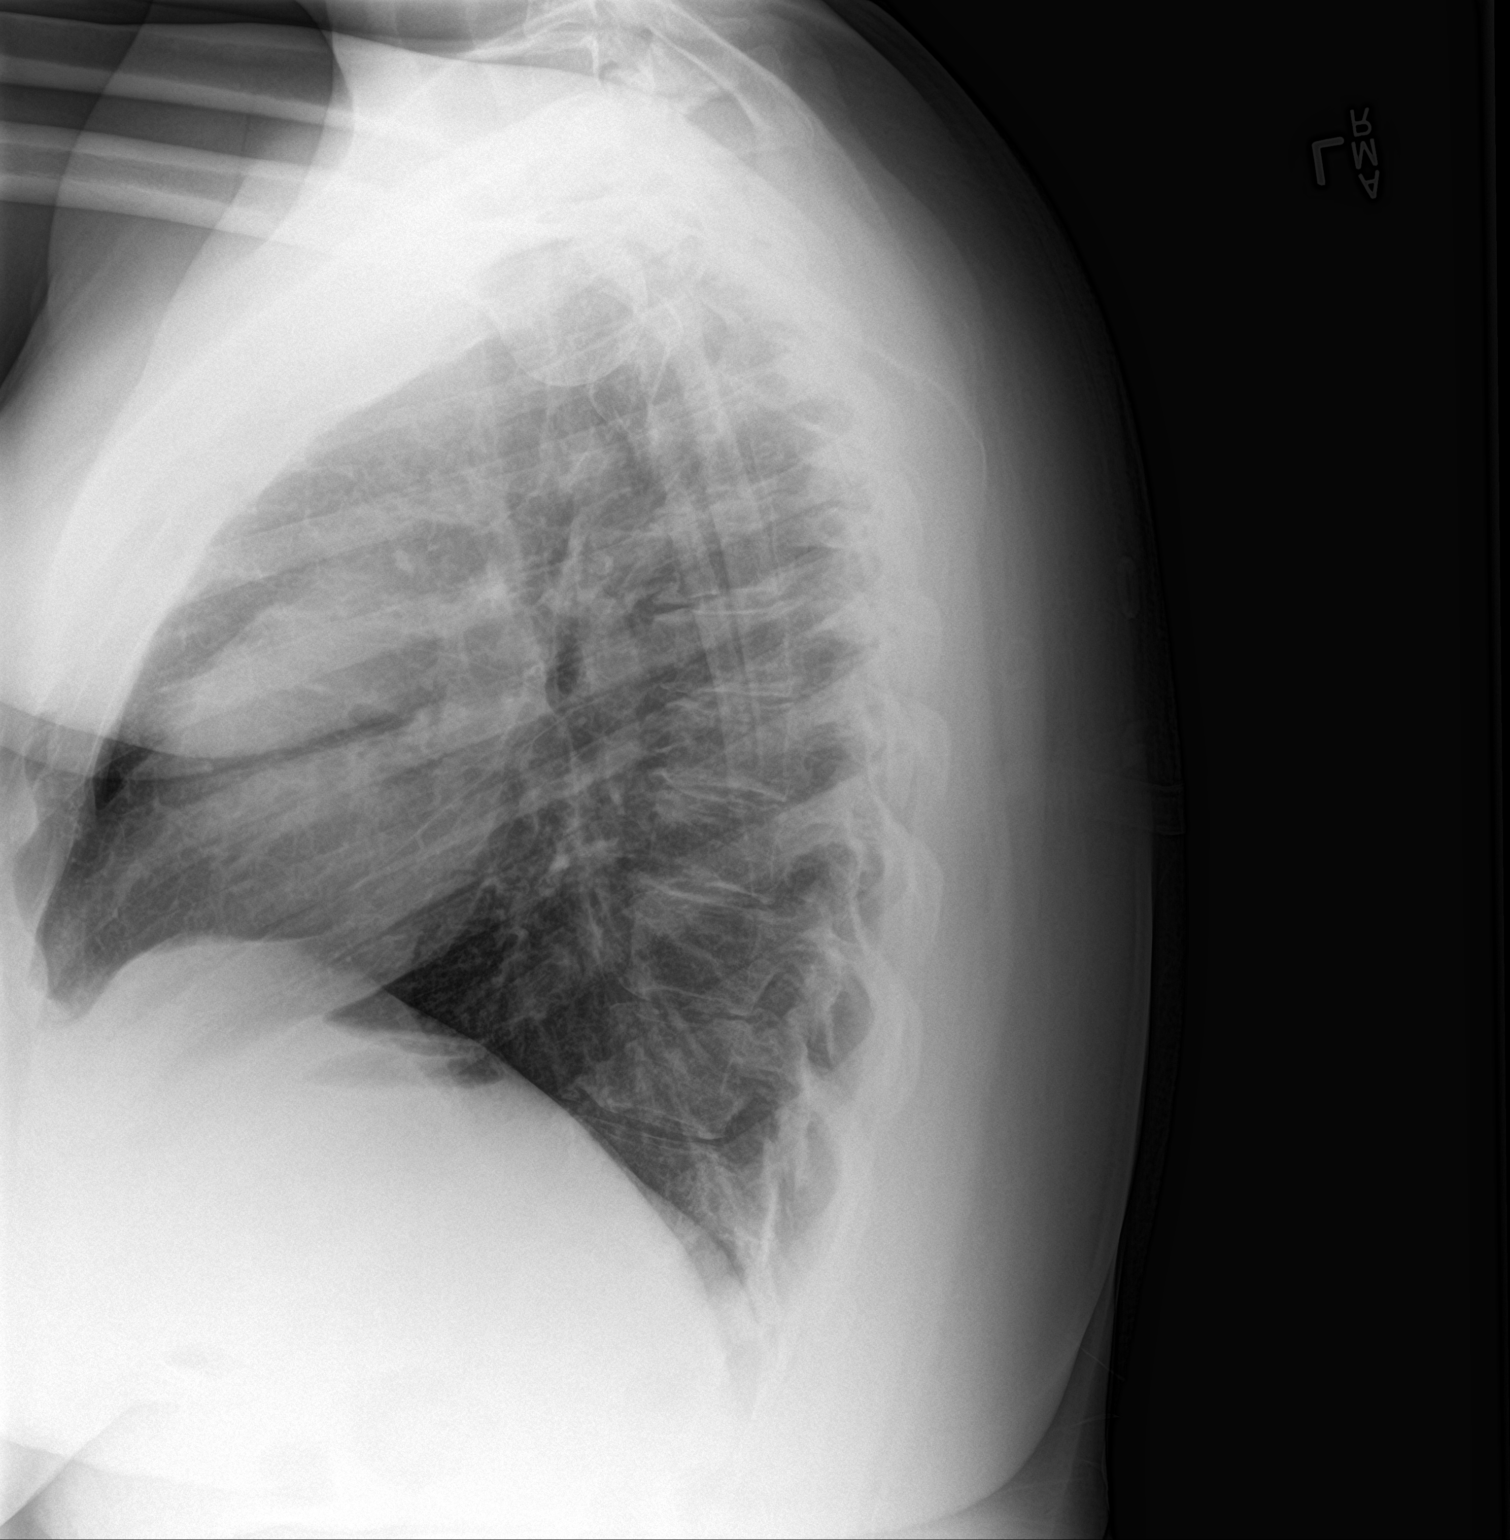

[2 of 2 positions shown; findings below may reference images not displayed]

FINDINGS: The heart size and mediastinal contours are within normal limits.
Both lungs are clear. Scoliosis of the spine.
IMPRESSION: No active cardiopulmonary disease.

## 2019-08-25 ENCOUNTER — Other Ambulatory Visit: Payer: Self-pay

## 2019-08-25 ENCOUNTER — Ambulatory Visit
Admission: EM | Admit: 2019-08-25 | Discharge: 2019-08-25 | Disposition: A | Discharge status: Home / Self Care | Payer: No Typology Code available for payment source | Attending: Emergency Medicine | Admitting: Emergency Medicine

## 2019-08-25 DIAGNOSIS — S61011A Laceration without foreign body of right thumb without damage to nail, initial encounter: Secondary | ICD-10-CM

## 2019-08-25 MED ORDER — TETANUS-DIPHTH-ACELL PERTUSSIS 5-2.5-18.5 LF-MCG/0.5 IM SUSP
0.5000 mL | Freq: Once | INTRAMUSCULAR | Status: AC
  Administered 2019-08-25: 0.5 mL via INTRAMUSCULAR

## 2019-08-25 MED ORDER — CEPHALEXIN 500 MG PO CAPS: 500.0000 mg | ORAL_CAPSULE | Freq: Two times a day (BID) | ORAL | 0 refills | Status: AC

## 2019-08-25 NOTE — Discharge Instructions (Addendum)
Steri strips applied Keep covered for next and dry for next 24-48 hours.  After then you may gently clean with warm water and mild soap.  Avoid submerging wound in water.  Keep steri-strips on for apx 5 - 7 days Take OTC ibuprofen or tylenol as needed for pain relief Keflex prescribed to prevent infection.  Take as directed and to completion Return or go to the ED if you have any new or worsening symptoms such as increased pain, redness, swelling, drainage, discharge, decreased range of motion of extremity, etc..

## 2019-08-25 NOTE — ED Provider Notes (Addendum)
[facility]   [id number] 08/25/19 Arrival Time: 1937  CC: LACERATION  SUBJECTIVE:  Jessica [name] is a 34 y.o. female who presents with a laceration that occurred prior to visit.  Symptoms began after cutting left thumb with knife.  Bleeding controlled.  Currently not on blood thinners.  Denies similar symptoms in the past.  Denies fever, chills, nausea, vomiting, redness, swelling, purulent drainage.    Td UTD: Unknown.  ROS: As per HPI.  All other pertinent ROS negative.     Past Medical History:  Diagnosis Date  . Chronic hip pain   . Closed left arm fracture    age [age]  . MS (multiple sclerosis) ([place])   . Scoliosis    Past Surgical History:  Procedure Laterality Date  . adnoids    . CHOLECYSTECTOMY    . NASAL SEPTUM SURGERY    . toe nails removed    . TONSILLECTOMY     No Known Allergies Current Facility-Administered Medications on File Prior to Encounter  Medication Dose Route Frequency Provider Last Rate Last Admin  . gadopentetate dimeglumine (MAGNEVIST) injection 20 mL  20 mL Intravenous Once PRN Melvenia Beam, MD       Current Outpatient Medications on File Prior to Encounter  Medication Sig Dispense Refill  . buPROPion (WELLBUTRIN XL) 300 MG 24 hr tablet TAKE 1 TABLET (300 MG TOTAL) BY MOUTH DAILY. 90 tablet 3  . cetirizine (ZYRTEC) 10 MG tablet Take 10 mg by mouth daily.    . Diroximel Fumarate, Starter, (VUMERITY, STARTER,) 231 MG CPDR Take by mouth.    . DULoxetine (CYMBALTA) 60 MG capsule Take 1 capsule (60 mg total) by mouth daily. 90 capsule 3  . gabapentin (NEURONTIN) 300 MG capsule Take 1 capsule (300 mg total) by mouth 3 (three) times daily. 270 capsule 3  . ibuprofen (ADVIL,MOTRIN) 200 MG tablet Take 400 mg by mouth daily as needed.     Marland Kitchen levonorgestrel (MIRENA) 20 MCG/24HR IUD 1 each by Intrauterine route once.      . mirabegron ER (MYRBETRIQ) 50 MG TB24 tablet Take 1 tablet (50 mg total) by mouth daily. 30 tablet 5  . solifenacin  (VESICARE) 10 MG tablet Take 1 tablet (10 mg total) by mouth daily. 90 tablet 3  . VITAMIN D PO Take by mouth daily.     Social History   Socioeconomic History  . Marital status: Married    Spouse name: Not on file  . Number of children: Not on file  . Years of education: Not on file  . Highest education level: Not on file  Occupational History  . Not on file  Tobacco Use  . Smoking status: Current Every Day Smoker    Packs/day: 1.00    Years: 13.00    Pack years: 13.00    Types: Cigarettes  . Smokeless tobacco: Never Used  Vaping Use  . Vaping Use: Never used  Substance and Sexual Activity  . Alcohol use: Not Currently  . Drug use: Yes    Types: Marijuana    Comment: occ  . Sexual activity: Yes    Birth control/protection: I.U.D.  Other Topics Concern  . Not on file  Social History Narrative  . Not on file   Social Determinants of Health   Financial Resource Strain:   . Difficulty of Paying Living Expenses:   Food Insecurity:   . Worried About Charity fundraiser in the Last Year:   . Arboriculturist in  the Last Year:   Transportation Needs:   . Freight forwarder (Medical):   Marland Kitchen Lack of Transportation (Non-Medical):   Physical Activity:   . Days of Exercise per Week:   . Minutes of Exercise per Session:   Stress:   . Feeling of Stress :   Social Connections:   . Frequency of Communication with Friends and Family:   . Frequency of Social Gatherings with Friends and Family:   . Attends Religious Services:   . Active Member of Clubs or Organizations:   . Attends Banker Meetings:   Marland Kitchen Marital Status:   Intimate Partner Violence:   . Fear of Current or Ex-Partner:   . Emotionally Abused:   Marland Kitchen Physically Abused:   . Sexually Abused:    Family History  Problem Relation Age of Onset  . Hypertension Mother   . Diabetes Mother        prediabetic  . COPD Mother   . Diabetes Paternal Grandfather   . Hypertension Maternal Grandmother   . Other  Maternal Grandmother   . Addison's disease Maternal Grandmother   . Cancer Maternal Grandfather      OBJECTIVE:  Vitals:   08/25/19 1950  BP: 109/63  Pulse: 67  Resp: 17  Temp: 98.3 F (36.8 C)  TempSrc: Oral  SpO2: 98%     General appearance: alert; no distress CV: radial pulse 2+ Skin: laceration of RT thumb; apx 0.5 -1 cm, superficial  Psychological: alert and cooperative; normal mood and affect   ASSESSMENT & PLAN:  1. Laceration of right thumb without foreign body without damage to nail, initial encounter    Steri strips applied Keep covered for next and dry for next 24-48 hours.  After then you may gently clean with warm water and mild soap.  Avoid submerging wound in water.  Keep steri-strips on for apx 5 - 7 days Take OTC ibuprofen or tylenol as needed for pain relief Keflex prescribed to prevent infection.  Take as directed and to completion Return or go to the ED if you have any new or worsening symptoms such as increased pain, redness, swelling, drainage, discharge, decreased range of motion of extremity, etc..   Reviewed expectations re: course of current medical issues. Questions answered. Outlined signs and symptoms indicating need for more acute intervention. Patient verbalized understanding. After Visit Summary given.      Rennis Harding, PA-C 08/25/19 1953

## 2019-09-13 ENCOUNTER — Ambulatory Visit: Payer: No Typology Code available for payment source | Admitting: Family Medicine

## 2019-10-25 ENCOUNTER — Telehealth: Payer: Self-pay | Admitting: Neurology

## 2019-10-25 ENCOUNTER — Ambulatory Visit: Payer: No Typology Code available for payment source | Admitting: Neurology

## 2019-10-25 ENCOUNTER — Encounter: Payer: Self-pay | Admitting: Neurology

## 2019-10-25 VITALS — BP 113/69 | HR 64 | Ht 68.5 in | Wt 251.0 lb

## 2019-10-25 DIAGNOSIS — R5383 Other fatigue: Secondary | ICD-10-CM

## 2019-10-25 DIAGNOSIS — G4733 Obstructive sleep apnea (adult) (pediatric): Secondary | ICD-10-CM

## 2019-10-25 DIAGNOSIS — Z79899 Other long term (current) drug therapy: Secondary | ICD-10-CM | POA: Diagnosis not present

## 2019-10-25 DIAGNOSIS — G35 Multiple sclerosis: Secondary | ICD-10-CM

## 2019-10-25 DIAGNOSIS — R519 Headache, unspecified: Secondary | ICD-10-CM | POA: Diagnosis not present

## 2019-10-25 DIAGNOSIS — E559 Vitamin D deficiency, unspecified: Secondary | ICD-10-CM

## 2019-10-25 NOTE — Progress Notes (Signed)
[facility]  PATIENT: Jessica [name] DOB: 1985/10/09  REFERRING DOCTOR OR PCP:  Dr. Lucia Gaskins.   PCP is Social research officer, government SOURCE: Patient, notes from Dr. Lucia Gaskins, imaging and lab results, MRI images on PACS personally reviewed.  _________________________________   HISTORICAL  CHIEF COMPLAINT:  Chief Complaint  Patient presents with  . Follow-up    RM 12, alone.  Last seen 03/16/19.  Has been getting headaches that started a few months ago. About two weeks ago, she had a severe one. Had to leave work. OTC meds were ineffective. Has not had this severe of a headache since she was young.   . Multiple Sclerosis    On Vumerity. Doing okay on this.   . Depression    On Wellbutrin/cymbalta  . Urinary Frequency    On vesicare. Not taking myrbetriq.     HISTORY OF PRESENT ILLNESS:  Jessica [name] is a 34 y.o. woman with multiple sclerosis.  Update 10/25/2019: She is on Vumerity and tolerates it well.   MRI 04/25/2019 showed multiple T2/FLAIR hyperintense foci in the hemispheres in a pattern and configuration consistent with chronic demyelinating plaque associated with multiple sclerosis.  None of the foci enhances or appears to be acute.  Compared to the MRI from 04/05/2018, there are no new lesions. She has no exacerbations and denies any new MS symptoms.   Her gait is doing about the same with slightly reduced balance, noted most if she turns rapidly.   She has some paresthesias in the legs.    She no longer gets Lhermitte sign symptoms.    The numbness is resolved   She has no weakness.   She does not muscles fatigue easily and she feels overall weak.     She has more urinary urgency and has had rare urge incontinence.   She is not sure she gets much benefit from solifenacin.    She is having headaches lasting a few days.  These have been once a month or so.    She does not have nausea or photophobia but had visual changes with the one headache.   Excedrin migraine  and other NSAID's are not helping.    Laying down helps some but they do not resolve with sleep.       She had a PSG showing mild OSA with AHI=10   She has lost about 44 pounds and feels she is sleeping better.      She and her husband had Covid 19.       MS History: In March 2015, she had an episode of bilateral leg numbness and right arm numbness and MRI was recommended but she signed out against medical device from the emergency room.  She was numb from the waist down and had a Lhermitte sign.    This lasted 2 months and completely resolved.   She did not follow-up with neurology.   In mid February 2019, she had the onset of left facial dysesthesia followed by numbness including the tongue she also had some trouble swallowing. She had a scalded sensation in the tongue.    She feels she is about the same.   She feels slightly off with walking but balance is not bad.   She is able to climb stairs without problems.    DMTs:   She started Tecfidera 04/2017 and switched to Vumerity late 2020 due to copays.     Imaging: MRI of the brain dated 04/28/2017 showed multiple T2/flair hyperintense foci in  the periventricular, juxtacortical and deep white matter of both hemispheres, with many of the periventricular foci are radially oriented to the ventricles. She also had a focus in the left middle cerebellar peduncle. None of the foci enhanced.    On the thoracic spine images from 3 years ago, there is a possible T2 level cord focus but study is degraded by movement and interpretation is fairly difficult.      MRI of the brain dated 04/25/2019 showed multiple T2/FLAIR hyperintense foci in the hemispheres in a pattern and configuration consistent with chronic demyelinating plaque associated with multiple sclerosis.  None of the foci enhances or appears to be acute.  Compared to the MRI from 04/05/2018, there are no new lesions.  Lab work showed a negative ANA, TSH, CBC and CMP.      REVIEW OF  SYSTEMS: Constitutional: No fevers, chills, sweats, or change in appetite Eyes: No visual changes, double vision, eye pain Ear, nose and throat: No hearing loss, ear pain, nasal congestion, sore throat Cardiovascular: No chest pain, palpitations Respiratory: No shortness of breath at rest or with exertion.   No wheezes GastrointestinaI: No nausea, vomiting, diarrhea, abdominal pain, fecal incontinence Genitourinary: No dysuria, urinary retention or frequency.  No nocturia. Musculoskeletal: No neck pain, back pain Integumentary: No rash, pruritus, skin lesions Neurological: as above Psychiatric: No depression at this time.  No anxiety Endocrine: No palpitations, diaphoresis, change in appetite, change in weigh or increased thirst Hematologic/Lymphatic: No anemia, purpura, petechiae. Allergic/Immunologic: No itchy/runny eyes, nasal congestion, recent allergic reactions, rashes  ALLERGIES: No Known Allergies  HOME MEDICATIONS:  Current Outpatient Medications:  .  buPROPion (WELLBUTRIN XL) 300 MG 24 hr tablet, TAKE 1 TABLET (300 MG TOTAL) BY MOUTH DAILY., Disp: 90 tablet, Rfl: 3 .  cetirizine (ZYRTEC) 10 MG tablet, Take 10 mg by mouth daily., Disp: , Rfl:  .  Diroximel Fumarate (VUMERITY) 231 MG CPDR, Take 2 capsules by mouth 2 (two) times daily., Disp: , Rfl:  .  DULoxetine (CYMBALTA) 60 MG capsule, Take 1 capsule (60 mg total) by mouth daily., Disp: 90 capsule, Rfl: 3 .  gabapentin (NEURONTIN) 300 MG capsule, Take 1 capsule (300 mg total) by mouth 3 (three) times daily., Disp: 270 capsule, Rfl: 3 .  ibuprofen (ADVIL,MOTRIN) 200 MG tablet, Take 400 mg by mouth daily as needed. , Disp: , Rfl:  .  levonorgestrel (MIRENA) 20 MCG/24HR IUD, 1 each by Intrauterine route once.  , Disp: , Rfl:  .  solifenacin (VESICARE) 10 MG tablet, Take 1 tablet (10 mg total) by mouth daily., Disp: 90 tablet, Rfl: 3 .  VITAMIN D PO, Take by mouth daily., Disp: , Rfl:  No current facility-administered  medications for this visit.  Facility-Administered Medications Ordered in Other Visits:  .  gadopentetate dimeglumine (MAGNEVIST) injection 20 mL, 20 mL, Intravenous, Once PRN, Anson Fret, MD  PAST MEDICAL HISTORY: Past Medical History:  Diagnosis Date  . Chronic hip pain   . Closed left arm fracture    age [age]  . MS (multiple sclerosis) (HCC)   . Scoliosis     PAST SURGICAL HISTORY: Past Surgical History:  Procedure Laterality Date  . adnoids    . CHOLECYSTECTOMY    . NASAL SEPTUM SURGERY    . toe nails removed    . TONSILLECTOMY      FAMILY HISTORY: Family History  Problem Relation Age of Onset  . Hypertension Mother   . Diabetes Mother  prediabetic  . COPD Mother   . Diabetes Paternal Grandfather   . Hypertension Maternal Grandmother   . Other Maternal Grandmother   . Addison's disease Maternal Grandmother   . Cancer Maternal Grandfather     SOCIAL HISTORY:  Social History   Socioeconomic History  . Marital status: Married    Spouse name: Not on file  . Number of children: Not on file  . Years of education: Not on file  . Highest education level: Not on file  Occupational History  . Not on file  Tobacco Use  . Smoking status: Current Every Day Smoker    Packs/day: 1.00    Years: 13.00    Pack years: 13.00    Types: Cigarettes  . Smokeless tobacco: Never Used  Vaping Use  . Vaping Use: Never used  Substance and Sexual Activity  . Alcohol use: Not Currently  . Drug use: Yes    Types: Marijuana    Comment: occ  . Sexual activity: Yes    Birth control/protection: I.U.D.  Other Topics Concern  . Not on file  Social History Narrative  . Not on file   Social Determinants of Health   Financial Resource Strain:   . Difficulty of Paying Living Expenses: Not on file  Food Insecurity:   . Worried About Programme researcher, broadcasting/film/video in the Last Year: Not on file  . Ran Out of Food in the Last Year: Not on file  Transportation Needs:   . Lack  of Transportation (Medical): Not on file  . Lack of Transportation (Non-Medical): Not on file  Physical Activity:   . Days of Exercise per Week: Not on file  . Minutes of Exercise per Session: Not on file  Stress:   . Feeling of Stress : Not on file  Social Connections:   . Frequency of Communication with Friends and Family: Not on file  . Frequency of Social Gatherings with Friends and Family: Not on file  . Attends Religious Services: Not on file  . Active Member of Clubs or Organizations: Not on file  . Attends Banker Meetings: Not on file  . Marital Status: Not on file  Intimate Partner Violence:   . Fear of Current or Ex-Partner: Not on file  . Emotionally Abused: Not on file  . Physically Abused: Not on file  . Sexually Abused: Not on file     PHYSICAL EXAM  Vitals:   10/25/19 0821  BP: 113/69  Pulse: 64  Weight: 251 lb (113.9 kg)  Height: 5' 8.5" (1.74 m)    Body mass index is 37.61 kg/m.   General: The patient is well-developed and well-nourished and in no acute distress.   Sclera are anicteric.   Neck with good ROM.  Mild tenderness left occiput.    Neurologic Exam  Mental status: The patient is alert and oriented x 3 at the time of the examination. The patient has apparent normal recent and remote memory, with an apparently normal attention span and concentration ability.   Speech is normal.  Cranial nerves: Extraocular movements are full.    Color vision is symmetric.  Facial strength and sensation is normal.  Trapezius strength is normal.  No dysarthria is noted.    No obvious hearing deficits are noted.  Motor:  Muscle bulk is normal.   Tone is normal. Strength is  5 / 5 in all 4 extremities. Rapid alternating movements are performed normally  Sensory: Sensory testing is intact to soft  touch and vibration sensation in all 4 extremities.  Coordination: Cerebellar testing reveals good finger-nose-finger and heel-to-shin is now normal and  symmetric  Gait and station: Station is normal.   Normal gait and tandem gait. . Romberg is negative  Reflexes: Deep tendon reflexes are symmetric and normal bilaterally in the arms and increased at the knees with spread    There is no ankle clonus.    DIAGNOSTIC DATA (LABS, IMAGING, TESTING) - I reviewed patient records, labs, notes, testing and imaging myself where available.  Lab Results  Component Value Date   WBC 11.7 (H) 03/16/2019   HGB 14.8 03/16/2019   HCT 44.1 03/16/2019   MCV 90 03/16/2019   PLT 292 03/16/2019      Component Value Date/Time   NA 138 03/15/2018 1544   K 4.9 03/15/2018 1544   CL 102 03/15/2018 1544   CO2 24 03/15/2018 1544   GLUCOSE 75 03/15/2018 1544   GLUCOSE 94 03/21/2014 0628   BUN 17 03/15/2018 1544   CREATININE 0.72 03/15/2018 1544   CALCIUM 9.3 03/15/2018 1544   PROT 6.4 03/15/2018 1544   ALBUMIN 4.1 03/15/2018 1544   AST 12 03/15/2018 1544   ALT 14 03/15/2018 1544   ALKPHOS 47 03/15/2018 1544   BILITOT <0.2 03/15/2018 1544   GFRNONAA 111 03/15/2018 1544   GFRAA 128 03/15/2018 1544    Lab Results  Component Value Date   TSH 1.930 04/23/2017      ASSESSMENT AND PLAN  1. Multiple sclerosis (HCC)   2. OSA (obstructive sleep apnea)   3. High risk medication use   4. Nonintractable episodic headache, unspecified headache type   5. OBESITY, MORBID   6. Other fatigue   7. Vitamin D deficiency     1.     Continue Vumerity check CBC with differential, CMP, TSH.  Also check lipids due to obesity and vit D due to prior deficiency.  Around time of next visit in early 2022 will check brian MRi to determine if any subclinical progression and consider a different DMT if changes. 2.     Continue Wellbutrin Xl and Cymbalta for depression.   Renew Cymblta 3.      Vesicare for urinary dysfunction 4.     She has mild OSA. She has lost 44 pounds.   If able to keep weight off this should help. 5.     Return in 6 months or sooner if there are  new or worsening neurologic symptoms.   Madaleine Simmon A. Epimenio Foot, MD, [facility] 10/25/2019, 9:17 AM Certified in Neurology, Clinical Neurophysiology, Sleep Medicine, Pain Medicine and Neuroimaging  [facility] [address] [place], [place] [address] [phone]

## 2019-10-25 NOTE — Telephone Encounter (Signed)
Called pt, relayed Dr. Bonnita Hollow message. She verbalized understanding.

## 2019-10-25 NOTE — Telephone Encounter (Signed)
PT asked if Dr. Epimenio Foot was going to order her a MRI because she always does one once a year around this time. I said I did not see an order in for one but I would ask for her.

## 2019-10-25 NOTE — Telephone Encounter (Signed)
We did her last MRI 04/25/2019 so I was going to wait until around the time of her next visit

## 2019-10-26 LAB — COMPREHENSIVE METABOLIC PANEL WITH GFR
ALT: 10 IU/L (ref 0–32)
AST: 10 IU/L (ref 0–40)
Albumin/Globulin Ratio: 2 (ref 1.2–2.2)
Albumin: 4.1 g/dL (ref 3.8–4.8)
Alkaline Phosphatase: 47 IU/L — ABNORMAL LOW (ref 48–121)
BUN/Creatinine Ratio: 15 (ref 9–23)
BUN: 10 mg/dL (ref 6–20)
Bilirubin Total: 0.5 mg/dL (ref 0.0–1.2)
CO2: 27 mmol/L (ref 20–29)
Calcium: 9.1 mg/dL (ref 8.7–10.2)
Chloride: 106 mmol/L (ref 96–106)
Creatinine, Ser: 0.67 mg/dL (ref 0.57–1.00)
GFR calc Af Amer: 134 mL/min/1.73
GFR calc non Af Amer: 116 mL/min/1.73
Globulin, Total: 2.1 g/dL (ref 1.5–4.5)
Glucose: 81 mg/dL (ref 65–99)
Potassium: 4.8 mmol/L (ref 3.5–5.2)
Sodium: 141 mmol/L (ref 134–144)
Total Protein: 6.2 g/dL (ref 6.0–8.5)

## 2019-10-26 LAB — LIPID PANEL
Chol/HDL Ratio: 3.2 ratio (ref 0.0–4.4)
Cholesterol, Total: 144 mg/dL (ref 100–199)
HDL: 45 mg/dL (ref >39)
LDL Chol Calc (NIH): 88 mg/dL (ref 0–99)
Triglycerides: 51 mg/dL (ref 0–149)
VLDL Cholesterol Cal: 11 mg/dL (ref 5–40)

## 2019-10-26 LAB — CBC WITH DIFFERENTIAL/PLATELET
Basophils Absolute: 0 10*3/uL (ref 0.0–0.2)
Basos: 0 %
EOS (ABSOLUTE): 0.3 10*3/uL (ref 0.0–0.4)
Eos: 3 %
Hematocrit: 40.4 % (ref 34.0–46.6)
Hemoglobin: 13.2 g/dL (ref 11.1–15.9)
Immature Grans (Abs): 0 10*3/uL (ref 0.0–0.1)
Immature Granulocytes: 0 %
Lymphocytes Absolute: 2 10*3/uL (ref 0.7–3.1)
Lymphs: 24 %
MCH: 31.4 pg (ref 26.6–33.0)
MCHC: 32.7 g/dL (ref 31.5–35.7)
MCV: 96 fL (ref 79–97)
Monocytes Absolute: 0.4 10*3/uL (ref 0.1–0.9)
Monocytes: 5 %
Neutrophils Absolute: 5.6 10*3/uL (ref 1.4–7.0)
Neutrophils: 68 %
Platelets: 236 10*3/uL (ref 150–450)
RBC: 4.2 x10E6/uL (ref 3.77–5.28)
RDW: 12.4 % (ref 11.7–15.4)
WBC: 8.3 10*3/uL (ref 3.4–10.8)

## 2019-10-26 LAB — TSH: TSH: 1.51 u[IU]/mL (ref 0.450–4.500)

## 2019-10-26 LAB — VITAMIN D 25 HYDROXY (VIT D DEFICIENCY, FRACTURES): Vit D, 25-Hydroxy: 34.3 ng/mL (ref 30.0–100.0)

## 2019-10-30 ENCOUNTER — Other Ambulatory Visit: Payer: Self-pay | Admitting: Neurology

## 2019-10-30 MED ORDER — ZONISAMIDE 100 MG PO CAPS: 100.0000 mg | ORAL_CAPSULE | Freq: Every day | ORAL | 5 refills | Status: DC

## 2019-10-30 MED FILL — ZONISAMIDE 100 MG CAPSULE: 100 | 30 days supply | Qty: 30 | Fill #0

## 2019-11-01 MED FILL — MECLIZINE 25 MG TABLET: 25 | 8 days supply | Qty: 30 | Fill #0

## 2019-11-28 NOTE — Progress Notes (Signed)
Cardiology Office Note:    Date:  11/29/2019   ID:  Jessica [name], DOB 1985-04-20, MRN [id number]  PCP:  [facility]  [facility] Cardiologist:  Meriam Sprague, MD  [facility] Electrophysiologist:  None   Referring MD: [facility]    History of Present Illness:    Jessica [name] is a 34 y.o. female with a hx of multiple sclerosis and depression who was referred to clinic by [facility] for evaluation of palpitations.  Note from [facility] reviewed. Patient states that about 1 month ago she had an episode of palpitations and associated dizziness while standing at work. The palpitations lasted about 30 seconds but the dizziness persisted. She squatted down and her symptoms improved. Denies associated chest pain or SOB. No exertional symptoms or exertional lightheadedness. Has had continued dizziness intermittently that lasts a couple of minutes. It has made it hard for her to work because she is afraid she is going to fall.  Of note, the patient has a history of fainting in the past with prolonged standing. She started wearing compression stockings and it helped. She also reports that she has been incredibly stressed as her husband has been using heroin after he was denied pain medication for his sickle cell disease. She has been smoking more cigarettes (pack-per-day) and smoking marijuana. No other drug use. No other changes in medication. No significant caffeine use. She remains hydrated and carries water with her throughout the day.  Family history notable for mother: pre-diabetes, COPD. Father-hypertension.   LDL 88, HDL 45, TC 144, Cr 0.67, TSH 1.5, HgB 13.2  Past Medical History:  Diagnosis Date  . Chronic hip pain   . Closed left arm fracture    age [age]  . MS (multiple sclerosis) (HCC)   . Palpitations   . Scoliosis     Past Surgical History:  Procedure Laterality Date  . adnoids    . CHOLECYSTECTOMY     . NASAL SEPTUM SURGERY    . toe nails removed    . TONSILLECTOMY      Current Medications: Current Meds  Medication Sig  . buPROPion (WELLBUTRIN XL) 300 MG 24 hr tablet TAKE 1 TABLET (300 MG TOTAL) BY MOUTH DAILY.  . cetirizine (ZYRTEC) 10 MG tablet Take 10 mg by mouth daily.  . Diroximel Fumarate (VUMERITY) 231 MG CPDR Take 2 capsules by mouth 2 (two) times daily.  . DULoxetine (CYMBALTA) 60 MG capsule Take 1 capsule (60 mg total) by mouth daily.  Marland Kitchen gabapentin (NEURONTIN) 300 MG capsule Take 1 capsule (300 mg total) by mouth 3 (three) times daily.  Marland Kitchen ibuprofen (ADVIL,MOTRIN) 200 MG tablet Take 400 mg by mouth daily as needed.   Marland Kitchen levonorgestrel (MIRENA) 20 MCG/24HR IUD 1 each by Intrauterine route once.    . solifenacin (VESICARE) 10 MG tablet Take 1 tablet (10 mg total) by mouth daily.  Marland Kitchen VITAMIN D PO Take 1 capsule by mouth daily.   Marland Kitchen zonisamide (ZONEGRAN) 100 MG capsule Take 1 capsule (100 mg total) by mouth at bedtime.     Allergies:   Patient has no known allergies.   Social History   Socioeconomic History  . Marital status: Married    Spouse name: Not on file  . Number of children: Not on file  . Years of education: Not on file  . Highest education level: Not on file  Occupational History  . Not on file  Tobacco Use  . Smoking status:  Current Every Day Smoker    Packs/day: 1.00    Years: 13.00    Pack years: 13.00    Types: Cigarettes  . Smokeless tobacco: Never Used  Vaping Use  . Vaping Use: Never used  Substance and Sexual Activity  . Alcohol use: Not Currently  . Drug use: Yes    Types: Marijuana    Comment: occ  . Sexual activity: Yes    Birth control/protection: I.U.D.  Other Topics Concern  . Not on file  Social History Narrative  . Not on file   Social Determinants of Health   Financial Resource Strain:   . Difficulty of Paying Living Expenses: Not on file  Food Insecurity:   . Worried About Programme researcher, broadcasting/film/video in the Last Year: Not on file    . Ran Out of Food in the Last Year: Not on file  Transportation Needs:   . Lack of Transportation (Medical): Not on file  . Lack of Transportation (Non-Medical): Not on file  Physical Activity:   . Days of Exercise per Week: Not on file  . Minutes of Exercise per Session: Not on file  Stress:   . Feeling of Stress : Not on file  Social Connections:   . Frequency of Communication with Friends and Family: Not on file  . Frequency of Social Gatherings with Friends and Family: Not on file  . Attends Religious Services: Not on file  . Active Member of Clubs or Organizations: Not on file  . Attends Banker Meetings: Not on file  . Marital Status: Not on file     Family History: The patient's family history includes Addison's disease in her maternal grandmother; COPD in her mother; Cancer in her maternal grandfather; Diabetes in her mother and paternal grandfather; Hypertension in her maternal grandmother and mother; Other in her maternal grandmother.  ROS:   Please see the history of present illness.     All other systems reviewed and are negative.  EKGs/Labs/Other Studies Reviewed:    The following studies were reviewed today:  No cardiac studies in the system  EKG:  EKG is ordered today.  The ekg ordered today demonstrates NSR with HR 64.  Recent Labs: 10/25/2019: ALT 10; BUN 10; Creatinine, Ser 0.67; Hemoglobin 13.2; Platelets 236; Potassium 4.8; Sodium 141; TSH 1.510  Recent Lipid Panel    Component Value Date/Time   CHOL 144 10/25/2019 0917   TRIG 51 10/25/2019 0917   HDL 45 10/25/2019 0917   CHOLHDL 3.2 10/25/2019 0917   CHOLHDL 4.5 Ratio 10/29/2009 2125   VLDL 26 10/29/2009 2125   LDLCALC 88 10/25/2019 0917    Physical Exam:    VS:  Pulse 64   Ht 5\' 8"  (1.727 m)   Wt 253 lb (114.8 kg)   SpO2 98%   BMI 38.47 kg/m     Wt Readings from Last 3 Encounters:  11/29/19 253 lb (114.8 kg)  10/25/19 251 lb (113.9 kg)  03/16/19 294 lb 8 oz (133.6 kg)      GEN:  Well nourished, well developed in no acute distress HEENT: Normal NECK: No JVD; No carotid bruits LYMPHATICS: No lymphadenopathy CARDIAC: RRR, no murmurs, rubs, gallops RESPIRATORY:  Clear to auscultation without rales, wheezing or rhonchi  ABDOMEN: Soft, non-tender, non-distended MUSCULOSKELETAL:  1+ ankle edema  SKIN: Warm and dry. Mild venous stasis changes NEUROLOGIC:  Alert and oriented x 3 PSYCHIATRIC:  Normal affect   ASSESSMENT:    1. Palpitations   2.  Lightheadedness   3. 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 States that the palpitations lasted about 30 seconds and then the lightheadedness came on and persisted for several minutes. Improved with squatting down. Has had several more episodes of lightheadedness but no other significant palpitations (has intermittent sensation that heart is "skipping a beat.") Of note, has fainted several times when she was young after prolonged standing. Symptoms seemed to improve with compression stockings. -Plan for zio patch x 2 weeks -Suspect lightheadedness is due to orthostasis with venous pooling after prolonged standing -Recommend compression stockings and good hydration; education about counter-maneuvers (squatting, laying down with feet up) provided -Smoking cessation counseling was provided as this may help with palpitations and certainly decreases CV risk -TSH normal 1.510, HgB normal  #Tobacco Use: Spent a significant amount of time talking about tobacco cessation. She has successfully quit in the past but tends to go back smoking when stressful things happen in her life. She is unsure whether she is ready to quit now given the stress with her husband. She states she is going to try and will reach out if she is interested in pharmacologic assistance. -Continue to encourage smoking  cessation   Medication Adjustments/Labs and Tests Ordered: Current medicines are reviewed at length with the patient today.  Concerns regarding medicines are outlined above.  Orders Placed This Encounter  Procedures  . LONG TERM MONITOR (3-14 DAYS)  . EKG 12-Lead   No orders of the defined types were placed in this encounter.   Patient Instructions  Medication Instructions:  Your physician recommends that you continue on your current medications as directed. Please refer to the Current Medication list given to you today.  *If you need a refill on your cardiac medications before your next appointment, please call your pharmacy*   Lab Work: None  If you have labs (blood work) drawn today and your tests are completely normal, you will receive your results only by: Marland Kitchen MyChart Message (if you have MyChart) OR . A paper copy in the mail If you have any lab test that is abnormal or we need to change your treatment, we will call you to review the results.   Testing/Procedures: Your physician has recommended that you wear a 14 day monitor. These monitors are medical devices that record the heart's electrical activity. Doctors most often use these monitors to diagnose arrhythmias. Arrhythmias are problems with the speed or rhythm of the heartbeat. The monitor is a small, portable device. You can wear one while you do your normal daily activities. This is usually used to diagnose what is causing palpitations/syncope (passing out).  Follow-Up: Follow up with Dr. Shari Prows on 01/09/20 at 9:20 AM   Other Instructions ZIO XT- Long Term Monitor Instructions   Your physician has requested you wear your ZIO patch monitor 14 days.   This is a single patch monitor.  [employer] supplies one patch monitor per enrollment.  Additional stickers are not available.   Please do not apply patch if you will be having a Nuclear Stress Test, Echocardiogram, Cardiac CT, MRI, or Chest Xray during the time frame  you would be wearing the monitor. The patch cannot be worn during these tests.  You cannot remove and re-apply the ZIO XT patch monitor.   Your ZIO patch monitor will be sent USPS Priority mail from [facility] directly to  your home address. The monitor may also be mailed to a PO BOX if home delivery is not available.   It may take 3-5 days to receive your monitor after you have been enrolled.   Once you have received you monitor, please review enclosed instructions.  Your monitor has already been registered assigning a specific monitor serial # to you.   Applying the monitor   Shave hair from upper left chest.   Hold abrader disc by orange tab.  Rub abrader in 40 strokes over left upper chest as indicated in your monitor instructions.   Clean area with 4 enclosed alcohol pads .  Use all pads to assure are is cleaned thoroughly.  Let dry.   Apply patch as indicated in monitor instructions.  Patch will be place under collarbone on left side of chest with arrow pointing upward.   Rub patch adhesive wings for 2 minutes.Remove white label marked "1".  Remove white label marked "2".  Rub patch adhesive wings for 2 additional minutes.   While looking in a mirror, press and release button in center of patch.  A small green light will flash 3-4 times .  This will be your only indicator the monitor has been turned on.     Do not shower for the first 24 hours.  You may shower after the first 24 hours.   Press button if you feel a symptom. You will hear a small click.  Record Date, Time and Symptom in the Patient Log Book.   When you are ready to remove patch, follow instructions on last 2 pages of Patient Log Book.  Stick patch monitor onto last page of Patient Log Book.   Place Patient Log Book in [place] box.  Use locking tab on box and tape box closed securely.  The [employer] and [employer] has [employer] on it.  Please place in mailbox as soon as possible.  Your physician should have your  test results approximately 7 days after the monitor has been mailed back to [facility].   Call [facility] Customer Care at [phone] if you have questions regarding your ZIO XT patch monitor.  Call them immediately if you see an orange light blinking on your monitor.   If your monitor falls off in less than 4 days contact our Monitor department at [phone].  If your monitor becomes loose or falls off after 4 days call [employer] at [phone] for suggestions on securing your monitor.     Orthostatic Hypotension Blood pressure is a measurement of how strongly, or weakly, your blood is pressing against the walls of your arteries. Orthostatic hypotension is a sudden drop in blood pressure that happens when you quickly change positions, such as when you get up from sitting or lying down. Arteries are blood vessels that carry blood from your heart throughout your body. When blood pressure is too low, you may not get enough blood to your brain or to the rest of your organs. This can cause weakness, light-headedness, rapid heartbeat, and fainting. This can last for just a few seconds or for up to a few minutes. Orthostatic hypotension is usually not a serious problem. However, if it happens frequently or gets worse, it may be a sign of something more serious. What are the causes? This condition may be caused by:  Sudden changes in posture, such as standing up quickly after you have been sitting or lying down.  Blood loss.  Loss of body fluids (dehydration).  Heart  problems.  Hormone (endocrine) problems.  Pregnancy.  Severe infection.  Lack of certain nutrients.  Severe allergic reactions (anaphylaxis).  Certain medicines, such as blood pressure medicine or medicines that make the body lose excess fluids (diuretics). Sometimes, this condition can be caused by not taking medicine as directed, such as taking too much of a certain medicine. What increases the risk? The  following factors may make you more likely to develop this condition:  Age. Risk increases as you get older.  Conditions that affect the heart or the central nervous system.  Taking certain medicines, such as blood pressure medicine or diuretics.  Being pregnant. What are the signs or symptoms? Symptoms of this condition may include:  Weakness.  Light-headedness.  Dizziness.  Blurred vision.  Fatigue.  Rapid heartbeat.  Fainting, in severe cases. How is this diagnosed? This condition is diagnosed based on:  Your medical history.  Your symptoms.  Your blood pressure measurement. Your health care provider will check your blood pressure when you are: ? Lying down. ? Sitting. ? Standing. A blood pressure reading is recorded as two numbers, such as "120 over 80" (or 120/80). The first ("top") number is called the systolic pressure. It is a measure of the pressure in your arteries as your heart beats. The second ("bottom") number is called the diastolic pressure. It is a measure of the pressure in your arteries when your heart relaxes between beats. Blood pressure is measured in a unit called mm Hg. Healthy blood pressure for most adults is 120/80. If your blood pressure is below 90/60, you may be diagnosed with hypotension. Other information or tests that may be used to diagnose orthostatic hypotension include:  Your other vital signs, such as your heart rate and temperature.  Blood tests.  Tilt table test. For this test, you will be safely secured to a table that moves you from a lying position to an upright position. Your heart rhythm and blood pressure will be monitored during the test. How is this treated? This condition may be treated by:  Changing your diet. This may involve eating more salt (sodium) or drinking more water.  Taking medicines to raise your blood pressure.  Changing the dosage of certain medicines you are taking that might be lowering your blood  pressure.  Wearing compression stockings. These stockings help to prevent blood clots and reduce swelling in your legs. In some cases, you may need to go to the hospital for:  Fluid replacement. This means you will receive fluids through an IV.  Blood replacement. This means you will receive donated blood through an IV (transfusion).  Treating an infection or heart problems, if this applies.  Monitoring. You may need to be monitored while medicines that you are taking wear off. Follow these instructions at home: Eating and drinking   Drink enough fluid to keep your urine pale yellow.  Eat a healthy diet, and follow instructions from your health care provider about eating or drinking restrictions. A healthy diet includes: ? Fresh fruits and vegetables. ? Whole grains. ? Lean meats. ? Low-fat dairy products.  Eat extra salt only as directed. Do not add extra salt to your diet unless your health care provider told you to do that.  Eat frequent, small meals.  Avoid standing up suddenly after eating. Medicines  Take over-the-counter and prescription medicines only as told by your health care provider. ? Follow instructions from your health care provider about changing the dosage of your current medicines, if this  applies. ? Do not stop or adjust any of your medicines on your own. General instructions   Wear compression stockings as told by your health care provider.  Get up slowly from lying down or sitting positions. This gives your blood pressure a chance to adjust.  Avoid hot showers and excessive heat as directed by your health care provider.  Return to your normal activities as told by your health care provider. Ask your health care provider what activities are safe for you.  Do not use any products that contain nicotine or tobacco, such as cigarettes, e-cigarettes, and chewing tobacco. If you need help quitting, ask your health care provider.  Keep all follow-up visits  as told by your health care provider. This is important. Contact a health care provider if you:  Vomit.  Have diarrhea.  Have a fever for more than 2-3 days.  Feel more thirsty than usual.  Feel weak and tired. Get help right away if you:  Have chest pain.  Have a fast or irregular heartbeat.  Develop numbness in any part of your body.  Cannot move your arms or your legs.  Have trouble speaking.  Become sweaty or feel light-headed.  Faint.  Feel short of breath.  Have trouble staying awake.  Feel confused. Summary  Orthostatic hypotension is a sudden drop in blood pressure that happens when you quickly change positions.  Orthostatic hypotension is usually not a serious problem.  It is diagnosed by having your blood pressure taken lying down, sitting, and then standing.  It may be treated by changing your diet or adjusting your medicines. This information is not intended to replace advice given to you by your health care provider. Make sure you discuss any questions you have with your health care provider. Document Revised: 08/12/2017 Document Reviewed: 08/12/2017 Elsevier Patient Education  2020 [employer].    Steps to Quit Smoking Smoking tobacco is the leading cause of preventable death. It can affect almost every organ in the body. Smoking puts you and people around you at risk for many serious, long-lasting (chronic) diseases. Quitting smoking can be hard, but it is one of the best things that you can do for your health. It is never too late to quit. How do I get ready to quit? When you decide to quit smoking, make a plan to help you succeed. Before you quit:  Pick a date to quit. Set a date within the next 2 weeks to give you time to prepare.  Write down the reasons why you are quitting. Keep this list in places where you will see it often.  Tell your family, friends, and co-workers that you are quitting. Their support is important.  Talk with your  doctor about the choices that may help you quit.  Find out if your health insurance will pay for these treatments.  Know the people, places, things, and activities that make you want to smoke (triggers). Avoid them. What first steps can I take to quit smoking?  Throw away all cigarettes at home, at work, and in your car.  Throw away the things that you use when you smoke, such as ashtrays and lighters.  Clean your car. Make sure to empty the ashtray.  Clean your home, including curtains and carpets. What can I do to help me quit smoking? Talk with your doctor about taking medicines and seeing a counselor at the same time. You are more likely to succeed when you do both.  If you are  pregnant or breastfeeding, talk with your doctor about counseling or other ways to quit smoking. Do not take medicine to help you quit smoking unless your doctor tells you to do so. To quit smoking: Quit right away  Quit smoking totally, instead of slowly cutting back on how much you smoke over a period of time.  Go to counseling. You are more likely to quit if you go to counseling sessions regularly. Take medicine You may take medicines to help you quit. Some medicines need a prescription, and some you can buy over-the-counter. Some medicines may contain a drug called nicotine to replace the nicotine in cigarettes. Medicines may:  Help you to stop having the desire to smoke (cravings).  Help to stop the problems that come when you stop smoking (withdrawal symptoms). Your doctor may ask you to use:  Nicotine patches, gum, or lozenges.  Nicotine inhalers or sprays.  Non-nicotine medicine that is taken by mouth. Find resources Find resources and other ways to help you quit smoking and remain smoke-free after you quit. These resources are most helpful when you use them often. They include:  Online chats with a Veterinary surgeon.  Phone quitlines.  Printed Materials engineer.  Support groups or group  counseling.  Text messaging programs.  Mobile phone apps. Use apps on your mobile phone or tablet that can help you stick to your quit plan. There are many free apps for mobile phones and tablets as well as websites. Examples include Quit Guide from the [employer] and smokefree.gov  What things can I do to make it easier to quit?   Talk to your family and friends. Ask them to support and encourage you.  Call a phone quitline (1-800-QUIT-NOW), reach out to support groups, or work with a Veterinary surgeon.  Ask people who smoke to not smoke around you.  Avoid places that make you want to smoke, such as: ? Bars. ? Parties. ? Smoke-break areas at work.  Spend time with people who do not smoke.  Lower the stress in your life. Stress can make you want to smoke. Try these things to help your stress: ? Getting regular exercise. ? Doing deep-breathing exercises. ? Doing yoga. ? Meditating. ? Doing a body scan. To do this, close your eyes, focus on one area of your body at a time from head to toe. Notice which parts of your body are tense. Try to relax the muscles in those areas. How will I feel when I quit smoking? Day 1 to 3 weeks Within the first 24 hours, you may start to have some problems that come from quitting tobacco. These problems are very bad 2-3 days after you quit, but they do not often last for more than 2-3 weeks. You may get these symptoms:  Mood swings.  Feeling restless, nervous, angry, or annoyed.  Trouble concentrating.  Dizziness.  Strong desire for high-sugar foods and nicotine.  Weight gain.  Trouble pooping (constipation).  Feeling like you may vomit (nausea).  Coughing or a sore throat.  Changes in how the medicines that you take for other issues work in your body.  Depression.  Trouble sleeping (insomnia). Week 3 and afterward After the first 2-3 weeks of quitting, you may start to notice more positive results, such as:  Better sense of smell and  taste.  Less coughing and sore throat.  Slower heart rate.  Lower blood pressure.  Clearer skin.  Better breathing.  Fewer sick days. Quitting smoking can be hard. Do not give up  if you fail the first time. Some people need to try a few times before they succeed. Do your best to stick to your quit plan, and talk with your doctor if you have any questions or concerns. Summary  Smoking tobacco is the leading cause of preventable death. Quitting smoking can be hard, but it is one of the best things that you can do for your health.  When you decide to quit smoking, make a plan to help you succeed.  Quit smoking right away, not slowly over a period of time.  When you start quitting, seek help from your doctor, family, or friends. This information is not intended to replace advice given to you by your health care provider. Make sure you discuss any questions you have with your health care provider. Document Revised: 11/11/2018 Document Reviewed: 05/07/2018 Elsevier Patient Education  2020 [employer].     Signed, Meriam Sprague, MD  11/29/2019 4:53 PM    [facility]

## 2019-11-29 ENCOUNTER — Telehealth: Payer: Self-pay | Admitting: Radiology

## 2019-11-29 ENCOUNTER — Ambulatory Visit (INDEPENDENT_AMBULATORY_CARE_PROVIDER_SITE_OTHER): Payer: No Typology Code available for payment source | Admitting: Cardiology

## 2019-11-29 ENCOUNTER — Other Ambulatory Visit: Payer: Self-pay

## 2019-11-29 VITALS — HR 64 | Ht 68.0 in | Wt 253.0 lb

## 2019-11-29 DIAGNOSIS — Z720 Tobacco use: Secondary | ICD-10-CM

## 2019-11-29 DIAGNOSIS — R42 Dizziness and giddiness: Secondary | ICD-10-CM | POA: Diagnosis not present

## 2019-11-29 DIAGNOSIS — R002 Palpitations: Secondary | ICD-10-CM | POA: Diagnosis not present

## 2019-11-29 NOTE — Patient Instructions (Addendum)
Medication Instructions:  Your physician recommends that you continue on your current medications as directed. Please refer to the Current Medication list given to you today.  *If you need a refill on your cardiac medications before your next appointment, please call your pharmacy*   Lab Work: None  If you have labs (blood work) drawn today and your tests are completely normal, you will receive your results only by: Marland Kitchen MyChart Message (if you have MyChart) OR . A paper copy in the mail If you have any lab test that is abnormal or we need to change your treatment, we will call you to review the results.   Testing/Procedures: Your physician has recommended that you wear a 14 day monitor. These monitors are medical devices that record the heart's electrical activity. Doctors most often use these monitors to diagnose arrhythmias. Arrhythmias are problems with the speed or rhythm of the heartbeat. The monitor is a small, portable device. You can wear one while you do your normal daily activities. This is usually used to diagnose what is causing palpitations/syncope (passing out).  Follow-Up: Follow up with Dr. Shari Prows on 01/09/20 at 9:20 AM   Other Instructions ZIO XT- Long Term Monitor Instructions   Your physician has requested you wear your ZIO patch monitor 14 days.   This is a single patch monitor.  [employer] supplies one patch monitor per enrollment.  Additional stickers are not available.   Please do not apply patch if you will be having a Nuclear Stress Test, Echocardiogram, Cardiac CT, MRI, or Chest Xray during the time frame you would be wearing the monitor. The patch cannot be worn during these tests.  You cannot remove and re-apply the ZIO XT patch monitor.   Your ZIO patch monitor will be sent USPS Priority mail from [facility] directly to your home address. The monitor may also be mailed to a PO BOX if home delivery is not available.   It may take 3-5 days to receive  your monitor after you have been enrolled.   Once you have received you monitor, please review enclosed instructions.  Your monitor has already been registered assigning a specific monitor serial # to you.   Applying the monitor   Shave hair from upper left chest.   Hold abrader disc by orange tab.  Rub abrader in 40 strokes over left upper chest as indicated in your monitor instructions.   Clean area with 4 enclosed alcohol pads .  Use all pads to assure are is cleaned thoroughly.  Let dry.   Apply patch as indicated in monitor instructions.  Patch will be place under collarbone on left side of chest with arrow pointing upward.   Rub patch adhesive wings for 2 minutes.Remove white label marked "1".  Remove white label marked "2".  Rub patch adhesive wings for 2 additional minutes.   While looking in a mirror, press and release button in center of patch.  A small green light will flash 3-4 times .  This will be your only indicator the monitor has been turned on.     Do not shower for the first 24 hours.  You may shower after the first 24 hours.   Press button if you feel a symptom. You will hear a small click.  Record Date, Time and Symptom in the Patient Log Book.   When you are ready to remove patch, follow instructions on last 2 pages of Patient Log Book.  Stick patch monitor onto last page of  Patient Log Book.   Place Patient Log Book in [place] box.  Use locking tab on box and tape box closed securely.  The [employer] and [employer] has [employer] on it.  Please place in mailbox as soon as possible.  Your physician should have your test results approximately 7 days after the monitor has been mailed back to [facility].   Call [facility] Customer Care at [phone] if you have questions regarding your ZIO XT patch monitor.  Call them immediately if you see an orange light blinking on your monitor.   If your monitor falls off in less than 4 days contact our Monitor department at  [phone].  If your monitor becomes loose or falls off after 4 days call [employer] at [phone] for suggestions on securing your monitor.     Orthostatic Hypotension Blood pressure is a measurement of how strongly, or weakly, your blood is pressing against the walls of your arteries. Orthostatic hypotension is a sudden drop in blood pressure that happens when you quickly change positions, such as when you get up from sitting or lying down. Arteries are blood vessels that carry blood from your heart throughout your body. When blood pressure is too low, you may not get enough blood to your brain or to the rest of your organs. This can cause weakness, light-headedness, rapid heartbeat, and fainting. This can last for just a few seconds or for up to a few minutes. Orthostatic hypotension is usually not a serious problem. However, if it happens frequently or gets worse, it may be a sign of something more serious. What are the causes? This condition may be caused by:  Sudden changes in posture, such as standing up quickly after you have been sitting or lying down.  Blood loss.  Loss of body fluids (dehydration).  Heart problems.  Hormone (endocrine) problems.  Pregnancy.  Severe infection.  Lack of certain nutrients.  Severe allergic reactions (anaphylaxis).  Certain medicines, such as blood pressure medicine or medicines that make the body lose excess fluids (diuretics). Sometimes, this condition can be caused by not taking medicine as directed, such as taking too much of a certain medicine. What increases the risk? The following factors may make you more likely to develop this condition:  Age. Risk increases as you get older.  Conditions that affect the heart or the central nervous system.  Taking certain medicines, such as blood pressure medicine or diuretics.  Being pregnant. What are the signs or symptoms? Symptoms of this condition may  include:  Weakness.  Light-headedness.  Dizziness.  Blurred vision.  Fatigue.  Rapid heartbeat.  Fainting, in severe cases. How is this diagnosed? This condition is diagnosed based on:  Your medical history.  Your symptoms.  Your blood pressure measurement. Your health care provider will check your blood pressure when you are: ? Lying down. ? Sitting. ? Standing. A blood pressure reading is recorded as two numbers, such as "120 over 80" (or 120/80). The first ("top") number is called the systolic pressure. It is a measure of the pressure in your arteries as your heart beats. The second ("bottom") number is called the diastolic pressure. It is a measure of the pressure in your arteries when your heart relaxes between beats. Blood pressure is measured in a unit called mm Hg. Healthy blood pressure for most adults is 120/80. If your blood pressure is below 90/60, you may be diagnosed with hypotension. Other information or tests that may be used to diagnose orthostatic  hypotension include:  Your other vital signs, such as your heart rate and temperature.  Blood tests.  Tilt table test. For this test, you will be safely secured to a table that moves you from a lying position to an upright position. Your heart rhythm and blood pressure will be monitored during the test. How is this treated? This condition may be treated by:  Changing your diet. This may involve eating more salt (sodium) or drinking more water.  Taking medicines to raise your blood pressure.  Changing the dosage of certain medicines you are taking that might be lowering your blood pressure.  Wearing compression stockings. These stockings help to prevent blood clots and reduce swelling in your legs. In some cases, you may need to go to the hospital for:  Fluid replacement. This means you will receive fluids through an IV.  Blood replacement. This means you will receive donated blood through an IV  (transfusion).  Treating an infection or heart problems, if this applies.  Monitoring. You may need to be monitored while medicines that you are taking wear off. Follow these instructions at home: Eating and drinking   Drink enough fluid to keep your urine pale yellow.  Eat a healthy diet, and follow instructions from your health care provider about eating or drinking restrictions. A healthy diet includes: ? Fresh fruits and vegetables. ? Whole grains. ? Lean meats. ? Low-fat dairy products.  Eat extra salt only as directed. Do not add extra salt to your diet unless your health care provider told you to do that.  Eat frequent, small meals.  Avoid standing up suddenly after eating. Medicines  Take over-the-counter and prescription medicines only as told by your health care provider. ? Follow instructions from your health care provider about changing the dosage of your current medicines, if this applies. ? Do not stop or adjust any of your medicines on your own. General instructions   Wear compression stockings as told by your health care provider.  Get up slowly from lying down or sitting positions. This gives your blood pressure a chance to adjust.  Avoid hot showers and excessive heat as directed by your health care provider.  Return to your normal activities as told by your health care provider. Ask your health care provider what activities are safe for you.  Do not use any products that contain nicotine or tobacco, such as cigarettes, e-cigarettes, and chewing tobacco. If you need help quitting, ask your health care provider.  Keep all follow-up visits as told by your health care provider. This is important. Contact a health care provider if you:  Vomit.  Have diarrhea.  Have a fever for more than 2-3 days.  Feel more thirsty than usual.  Feel weak and tired. Get help right away if you:  Have chest pain.  Have a fast or irregular heartbeat.  Develop  numbness in any part of your body.  Cannot move your arms or your legs.  Have trouble speaking.  Become sweaty or feel light-headed.  Faint.  Feel short of breath.  Have trouble staying awake.  Feel confused. Summary  Orthostatic hypotension is a sudden drop in blood pressure that happens when you quickly change positions.  Orthostatic hypotension is usually not a serious problem.  It is diagnosed by having your blood pressure taken lying down, sitting, and then standing.  It may be treated by changing your diet or adjusting your medicines. This information is not intended to replace advice given to you  by your health care provider. Make sure you discuss any questions you have with your health care provider. Document Revised: 08/12/2017 Document Reviewed: 08/12/2017 Elsevier Patient Education  2020 [employer].    Steps to Quit Smoking Smoking tobacco is the leading cause of preventable death. It can affect almost every organ in the body. Smoking puts you and people around you at risk for many serious, long-lasting (chronic) diseases. Quitting smoking can be hard, but it is one of the best things that you can do for your health. It is never too late to quit. How do I get ready to quit? When you decide to quit smoking, make a plan to help you succeed. Before you quit:  Pick a date to quit. Set a date within the next 2 weeks to give you time to prepare.  Write down the reasons why you are quitting. Keep this list in places where you will see it often.  Tell your family, friends, and co-workers that you are quitting. Their support is important.  Talk with your doctor about the choices that may help you quit.  Find out if your health insurance will pay for these treatments.  Know the people, places, things, and activities that make you want to smoke (triggers). Avoid them. What first steps can I take to quit smoking?  Throw away all cigarettes at home, at work, and in  your car.  Throw away the things that you use when you smoke, such as ashtrays and lighters.  Clean your car. Make sure to empty the ashtray.  Clean your home, including curtains and carpets. What can I do to help me quit smoking? Talk with your doctor about taking medicines and seeing a counselor at the same time. You are more likely to succeed when you do both.  If you are pregnant or breastfeeding, talk with your doctor about counseling or other ways to quit smoking. Do not take medicine to help you quit smoking unless your doctor tells you to do so. To quit smoking: Quit right away  Quit smoking totally, instead of slowly cutting back on how much you smoke over a period of time.  Go to counseling. You are more likely to quit if you go to counseling sessions regularly. Take medicine You may take medicines to help you quit. Some medicines need a prescription, and some you can buy over-the-counter. Some medicines may contain a drug called nicotine to replace the nicotine in cigarettes. Medicines may:  Help you to stop having the desire to smoke (cravings).  Help to stop the problems that come when you stop smoking (withdrawal symptoms). Your doctor may ask you to use:  Nicotine patches, gum, or lozenges.  Nicotine inhalers or sprays.  Non-nicotine medicine that is taken by mouth. Find resources Find resources and other ways to help you quit smoking and remain smoke-free after you quit. These resources are most helpful when you use them often. They include:  Online chats with a Veterinary surgeon.  Phone quitlines.  Printed Materials engineer.  Support groups or group counseling.  Text messaging programs.  Mobile phone apps. Use apps on your mobile phone or tablet that can help you stick to your quit plan. There are many free apps for mobile phones and tablets as well as websites. Examples include Quit Guide from the [employer] and smokefree.gov  What things can I do to make it easier  to quit?   Talk to your family and friends. Ask them to support and encourage you.  Call a phone quitline (1-800-QUIT-NOW), reach out to support groups, or work with a Veterinary surgeon.  Ask people who smoke to not smoke around you.  Avoid places that make you want to smoke, such as: ? Bars. ? Parties. ? Smoke-break areas at work.  Spend time with people who do not smoke.  Lower the stress in your life. Stress can make you want to smoke. Try these things to help your stress: ? Getting regular exercise. ? Doing deep-breathing exercises. ? Doing yoga. ? Meditating. ? Doing a body scan. To do this, close your eyes, focus on one area of your body at a time from head to toe. Notice which parts of your body are tense. Try to relax the muscles in those areas. How will I feel when I quit smoking? Day 1 to 3 weeks Within the first 24 hours, you may start to have some problems that come from quitting tobacco. These problems are very bad 2-3 days after you quit, but they do not often last for more than 2-3 weeks. You may get these symptoms:  Mood swings.  Feeling restless, nervous, angry, or annoyed.  Trouble concentrating.  Dizziness.  Strong desire for high-sugar foods and nicotine.  Weight gain.  Trouble pooping (constipation).  Feeling like you may vomit (nausea).  Coughing or a sore throat.  Changes in how the medicines that you take for other issues work in your body.  Depression.  Trouble sleeping (insomnia). Week 3 and afterward After the first 2-3 weeks of quitting, you may start to notice more positive results, such as:  Better sense of smell and taste.  Less coughing and sore throat.  Slower heart rate.  Lower blood pressure.  Clearer skin.  Better breathing.  Fewer sick days. Quitting smoking can be hard. Do not give up if you fail the first time. Some people need to try a few times before they succeed. Do your best to stick to your quit plan, and talk with  your doctor if you have any questions or concerns. Summary  Smoking tobacco is the leading cause of preventable death. Quitting smoking can be hard, but it is one of the best things that you can do for your health.  When you decide to quit smoking, make a plan to help you succeed.  Quit smoking right away, not slowly over a period of time.  When you start quitting, seek help from your doctor, family, or friends. This information is not intended to replace advice given to you by your health care provider. Make sure you discuss any questions you have with your health care provider. Document Revised: 11/11/2018 Document Reviewed: 05/07/2018 Elsevier Patient Education  2020 [employer].

## 2019-11-29 NOTE — Telephone Encounter (Signed)
Enrolled patient for a 14 day Zio XT  monitor to be mailed to patients home  °

## 2019-12-04 ENCOUNTER — Other Ambulatory Visit (INDEPENDENT_AMBULATORY_CARE_PROVIDER_SITE_OTHER): Payer: No Typology Code available for payment source

## 2019-12-04 ENCOUNTER — Ambulatory Visit: Payer: No Typology Code available for payment source | Admitting: Cardiology

## 2019-12-04 DIAGNOSIS — R002 Palpitations: Secondary | ICD-10-CM

## 2019-12-20 ENCOUNTER — Telehealth: Payer: Self-pay | Admitting: Cardiology

## 2019-12-20 NOTE — Telephone Encounter (Signed)
  Medical Record's Received FMLA Paperwork from [facility] 12/19/19. We will need Jessica [name]  to fill out a Release Form and make a payment of $29.  L/M on mychart to call Medical Records. Thank you have a great day.

## 2019-12-21 ENCOUNTER — Telehealth: Payer: Self-pay | Admitting: Cardiology

## 2019-12-21 DIAGNOSIS — Z0279 Encounter for issue of other medical certificate: Secondary | ICD-10-CM

## 2019-12-21 NOTE — Telephone Encounter (Signed)
Received Payment and Release form 12/21/19. Placed [employer] form in Dr. Shari Prows.

## 2019-12-26 NOTE — Telephone Encounter (Signed)
Received paperwork back & faxed to [facility] 12/26/19  [facility]

## 2019-12-29 NOTE — Telephone Encounter (Signed)
Form signed by Dr. Shari Prows, faxed to [facility] 12/29/19

## 2020-01-07 NOTE — Progress Notes (Signed)
Cardiology Office Note:    Date:  01/09/2020   ID:  Lindie Spruce Powell, DOB Oct 23, 1985, MRN [id number]  PCP:  [facility]  [facility] Cardiologist:  Meriam Sprague, MD  [facility] Electrophysiologist:  None   Referring MD: [facility]    History of Present Illness:    Jessica [name] is a 34 y.o. female with a hx of multiple sclerosis and depression who returns to clinic for follow-up of dizziness and palpitations.  Since our last visit, cardiac event monitor with no significant arrhythmias. Patient states that she has not had as many dizzy spells since wearing the compression socks. Palpitations are significantly improved as well and she is able to work through the day usually without isssues. Things are also better at home and her stress levels are also getting better, which she thinks has helped. She has cut back on smoking to 1 ppd from 1.5ppd and continues to feel motivated to quit. Overall, she is feeling much better.  No fevers, chills, nausea, syncope, LE edema or orthopnea. She has occasional leg cramps at night but her leg fatigue has significantly improved since wearing the compression socks.   Past Medical History:  Diagnosis Date  . Chronic hip pain   . Closed left arm fracture    age [age]  . MS (multiple sclerosis) (HCC)   . Palpitations   . Scoliosis     Past Surgical History:  Procedure Laterality Date  . adnoids    . CHOLECYSTECTOMY    . NASAL SEPTUM SURGERY    . toe nails removed    . TONSILLECTOMY      Current Medications: Current Meds  Medication Sig  . buPROPion (WELLBUTRIN XL) 300 MG 24 hr tablet TAKE 1 TABLET (300 MG TOTAL) BY MOUTH DAILY.  . cetirizine (ZYRTEC) 10 MG tablet Take 10 mg by mouth daily.  . Diroximel Fumarate (VUMERITY) 231 MG CPDR Take 2 capsules by mouth 2 (two) times daily.  . DULoxetine (CYMBALTA) 60 MG capsule Take 1 capsule (60 mg total) by mouth daily.  Marland Kitchen gabapentin (NEURONTIN)  300 MG capsule Take 1 capsule (300 mg total) by mouth 3 (three) times daily.  Marland Kitchen ibuprofen (ADVIL,MOTRIN) 200 MG tablet Take 400 mg by mouth daily as needed.   Marland Kitchen levonorgestrel (MIRENA) 20 MCG/24HR IUD 1 each by Intrauterine route once.    . solifenacin (VESICARE) 10 MG tablet Take 1 tablet (10 mg total) by mouth daily.  Marland Kitchen VITAMIN D PO Take 1 capsule by mouth daily.      Allergies:   Patient has no known allergies.   Social History   Socioeconomic History  . Marital status: Married    Spouse name: Not on file  . Number of children: Not on file  . Years of education: Not on file  . Highest education level: Not on file  Occupational History  . Not on file  Tobacco Use  . Smoking status: Current Every Day Smoker    Packs/day: 1.00    Years: 13.00    Pack years: 13.00    Types: Cigarettes  . Smokeless tobacco: Never Used  Vaping Use  . Vaping Use: Never used  Substance and Sexual Activity  . Alcohol use: Not Currently  . Drug use: Yes    Types: Marijuana    Comment: occ  . Sexual activity: Yes    Birth control/protection: I.U.D.  Other Topics Concern  . Not on file  Social History Narrative  . Not  on file   Social Determinants of Health   Financial Resource Strain:   . Difficulty of Paying Living Expenses: Not on file  Food Insecurity:   . Worried About Programme researcher, broadcasting/film/video in the Last Year: Not on file  . Ran Out of Food in the Last Year: Not on file  Transportation Needs:   . Lack of Transportation (Medical): Not on file  . Lack of Transportation (Non-Medical): Not on file  Physical Activity:   . Days of Exercise per Week: Not on file  . Minutes of Exercise per Session: Not on file  Stress:   . Feeling of Stress : Not on file  Social Connections:   . Frequency of Communication with Friends and Family: Not on file  . Frequency of Social Gatherings with Friends and Family: Not on file  . Attends Religious Services: Not on file  . Active Member of Clubs or  Organizations: Not on file  . Attends Banker Meetings: Not on file  . Marital Status: Not on file     Family History: The patient's family history includes Addison's disease in her maternal grandmother; COPD in her mother; Cancer in her maternal grandfather; Diabetes in her mother and paternal grandfather; Hypertension in her maternal grandmother and mother; Other in her maternal grandmother.  ROS:   Please see the history of present illness.    Review of Systems  Constitutional: Negative for chills and fever.  HENT: Negative for hearing loss.   Eyes: Negative for blurred vision.  Respiratory: Negative for cough and shortness of breath.   Cardiovascular: Positive for palpitations. Negative for chest pain, orthopnea, claudication, leg swelling and PND.  Gastrointestinal: Negative for heartburn, nausea and vomiting.  Genitourinary: Negative for dysuria.  Musculoskeletal: Positive for myalgias. Negative for falls.  Neurological: Negative for dizziness and loss of consciousness.  Psychiatric/Behavioral: Negative for substance abuse.    EKGs/Labs/Other Studies Reviewed:    The following studies were reviewed today: Event monitor 12/27/19:  Predominant rhythm was sinus with average HR 79.  Rare PACs, rare PVCs. No afib, VT or pauses.  Recent Labs: 10/25/2019: ALT 10; BUN 10; Creatinine, Ser 0.67; Hemoglobin 13.2; Platelets 236; Potassium 4.8; Sodium 141; TSH 1.510  Recent Lipid Panel    Component Value Date/Time   CHOL 144 10/25/2019 0917   TRIG 51 10/25/2019 0917   HDL 45 10/25/2019 0917   CHOLHDL 3.2 10/25/2019 0917   CHOLHDL 4.5 Ratio 10/29/2009 2125   VLDL 26 10/29/2009 2125   LDLCALC 88 10/25/2019 0917     Physical Exam:    VS:  BP 108/74   Pulse 67   Ht 5\' 8"  (1.727 m)   Wt 248 lb (112.5 kg)   SpO2 99%   BMI 37.71 kg/m     Wt Readings from Last 3 Encounters:  01/09/20 248 lb (112.5 kg)  11/29/19 253 lb (114.8 kg)  10/25/19 251 lb (113.9 kg)       GEN:  Well nourished, well developed in no acute distress HEENT: Normal NECK: No JVD; No carotid bruits CARDIAC: RRR, no murmurs, rubs, gallops RESPIRATORY:  Clear to auscultation without rales, wheezing or rhonchi  ABDOMEN: Soft, non-tender, non-distended MUSCULOSKELETAL:  No edema; No deformity  SKIN: Warm and dry NEUROLOGIC:  Alert and oriented x 3 PSYCHIATRIC:  Normal affect   ASSESSMENT:    1. Lightheadedness   2. Palpitations   3. Tobacco use    PLAN:    In order of problems listed above:  #  Orthostasis: #Palpitations: #Lightheadedness: Significantly improved since wearing compression stockings and increasing fluid intake. Monitor without significant arrhythmias or ectopy. TSH and hemoglobin normal. She is now able to work throughout the day without taking breaks. -Suspect lightheadedness is due to orthostasis with venous pooling after prolonged standing -Continue compression stockings and good hydration; education about counter-maneuvers (squatting, laying down with feet up) re-iterated -Continued to encourage smoking cessation as this may help with palpitations and certainly decreases CV risk  #Tobacco Use: Patient is motivated to quit and has already cut back significantly from 1.5ppd to 1ppd. She is continuing to try and cut back with the goal of completely quitting.  -Continue to encourage smoking cessation   Shared Decision Making/Informed Consent      Medication Adjustments/Labs and Tests Ordered: Current medicines are reviewed at length with the patient today.  Concerns regarding medicines are outlined above.  No orders of the defined types were placed in this encounter.  No orders of the defined types were placed in this encounter.   Patient Instructions  Medication Instructions:  Your physician recommends that you continue on your current medications as directed. Please refer to the Current Medication list given to you today.  *If you need a refill  on your cardiac medications before your next appointment, please call your pharmacy*   Lab Work: None If you have labs (blood work) drawn today and your tests are completely normal, you will receive your results only by: Marland Kitchen MyChart Message (if you have MyChart) OR . A paper copy in the mail If you have any lab test that is abnormal or we need to change your treatment, we will call you to review the results.   Follow-Up: At [facility], you and your health needs are our priority.  As part of our continuing mission to provide you with exceptional heart care, we have created designated Provider Care Teams.  These Care Teams include your primary Cardiologist (physician) and Advanced Practice Providers (APPs -  Physician Assistants and Nurse Practitioners) who all work together to provide you with the care you need, when you need it.  We recommend signing up for the patient portal called "MyChart".  Sign up information is provided on this After Visit Summary.  MyChart is used to connect with patients for Virtual Visits (Telemedicine).  Patients are able to view lab/test results, encounter notes, upcoming appointments, etc.  Non-urgent messages can be sent to your provider as well.   To learn more about what you can do with MyChart, go to [email or web address].    Your next appointment:   As Needed      Signed, Meriam Sprague, MD  01/09/2020 9:36 AM    [facility]

## 2020-01-09 ENCOUNTER — Encounter: Payer: Self-pay | Admitting: Cardiology

## 2020-01-09 ENCOUNTER — Ambulatory Visit: Payer: No Typology Code available for payment source | Admitting: Cardiology

## 2020-01-09 ENCOUNTER — Other Ambulatory Visit: Payer: Self-pay

## 2020-01-09 VITALS — BP 108/74 | HR 67 | Ht 68.0 in | Wt 248.0 lb

## 2020-01-09 DIAGNOSIS — R42 Dizziness and giddiness: Secondary | ICD-10-CM | POA: Diagnosis not present

## 2020-01-09 DIAGNOSIS — R002 Palpitations: Secondary | ICD-10-CM | POA: Diagnosis not present

## 2020-01-09 DIAGNOSIS — Z720 Tobacco use: Secondary | ICD-10-CM | POA: Diagnosis not present

## 2020-01-09 NOTE — Patient Instructions (Signed)
Medication Instructions:  Your physician recommends that you continue on your current medications as directed. Please refer to the Current Medication list given to you today.  *If you need a refill on your cardiac medications before your next appointment, please call your pharmacy*   Lab Work: None If you have labs (blood work) drawn today and your tests are completely normal, you will receive your results only by: MyChart Message (if you have MyChart) OR A paper copy in the mail If you have any lab test that is abnormal or we need to change your treatment, we will call you to review the results.   Follow-Up: At [facility], you and your health needs are our priority.  As part of our continuing mission to provide you with exceptional heart care, we have created designated Provider Care Teams.  These Care Teams include your primary Cardiologist (physician) and Advanced Practice Providers (APPs -  Physician Assistants and Nurse Practitioners) who all work together to provide you with the care you need, when you need it.  We recommend signing up for the patient portal called "MyChart".  Sign up information is provided on this After Visit Summary.  MyChart is used to connect with patients for Virtual Visits (Telemedicine).  Patients are able to view lab/test results, encounter notes, upcoming appointments, etc.  Non-urgent messages can be sent to your provider as well.   To learn more about what you can do with MyChart, go to [email or web address].    Your next appointment:   As Needed 

## 2020-01-29 ENCOUNTER — Other Ambulatory Visit: Payer: Self-pay | Admitting: Neurology

## 2020-01-29 ENCOUNTER — Other Ambulatory Visit: Payer: Self-pay | Admitting: *Deleted

## 2020-01-29 DIAGNOSIS — G35 Multiple sclerosis: Secondary | ICD-10-CM

## 2020-02-28 ENCOUNTER — Telehealth: Payer: Self-pay | Admitting: Neurology

## 2020-02-28 NOTE — Telephone Encounter (Signed)
PA for Vumerity started on covermymeds (key: [id number]). Pt has coverage through [employer] [phone]). Decision pending.

## 2020-02-28 NOTE — Telephone Encounter (Signed)
Abby from [facility] called stating that the pt is needing a PA for her VUMERITY 231 MG CPDR. Please advise.

## 2020-02-28 NOTE — Telephone Encounter (Signed)
Determination: The request has been approved. The authorization is effective for a maximum of 12 fills from 02/28/2020 to 02/26/2021, as long as the member is enrolled in their current health plan. This has been approved for a max daily dosage of 4.0.

## 2020-02-28 NOTE — Telephone Encounter (Signed)
PA started for Vumerity on [employer].  KEY: [id number] will await response

## 2020-02-29 ENCOUNTER — Other Ambulatory Visit: Payer: No Typology Code available for payment source

## 2020-02-29 DIAGNOSIS — Z20822 Contact with and (suspected) exposure to covid-19: Secondary | ICD-10-CM

## 2020-03-02 LAB — NOVEL CORONAVIRUS, NAA: SARS-CoV-2, NAA: NOT DETECTED

## 2020-03-02 LAB — SARS-COV-2, NAA 2 DAY TAT

## 2020-03-12 ENCOUNTER — Telehealth: Payer: Self-pay | Admitting: Neurology

## 2020-03-12 NOTE — Telephone Encounter (Signed)
Please advise her to increase the gabapentin to 5 pills a day (can do 1 in the morning, 2 in the afternoon and 2 at night) for the next week or so to see if that helps the pain. If she is not any better next week she should let us know.

## 2020-03-12 NOTE — Telephone Encounter (Signed)
Pt. states she is having numbness in feet & pain is shooting up in her left leg. She is asking what she should do. Please advise.

## 2020-03-12 NOTE — Telephone Encounter (Signed)
Patient states that she had intermittent numbness in both of your heels for the last couple weeks but today, her feet have become painful with shooting pain up left leg.  No swelling or redness or bruising to either leg. This is the first time this has ever happened.  The last time she had any numbness in her feet/legs was years ago before she was diagnosed with MS.     Patient is looking for suggestions for relief from the shooting pain.

## 2020-03-12 NOTE — Telephone Encounter (Signed)
Called patient and reviewed with patient Dr. Bonnita Hollow recommendation.  Patient verbalized understanding and denied any questions.

## 2020-04-10 ENCOUNTER — Telehealth: Payer: Self-pay | Admitting: Family Medicine

## 2020-04-10 NOTE — Telephone Encounter (Signed)
Called pt and collected payment for her "[employer]" FMLA form

## 2020-04-11 DIAGNOSIS — Z0289 Encounter for other administrative examinations: Secondary | ICD-10-CM

## 2020-04-15 NOTE — Telephone Encounter (Signed)
Gave completed/signed FMLA back to medical records to process for pt. 

## 2020-04-18 NOTE — Progress Notes (Signed)
Chief Complaint  Patient presents with  . Follow-up    RM 2 Alone Pt states she hasn't been coping well with this diagnosis, but her feet and hands have been going numb.      HISTORY OF PRESENT ILLNESS: 04/23/20 ALL:   Jessica [name] is a 35 y.o. female here today for follow up for RRMS. She was started on Vumerity late 2020. She reports that she stopped all of her medications about 6-8 months ago.  Last MR brian stable 04/2019.   She was having more pain in feet with shooting pains in left leg 03/2020. Gabapentin was increased to 300mg , 600mg  at lunch and 600mg  at bedtime. Uncertain if this has helped much. She is not consistent with medication dosing.   She was previously on bupropion 300mg  and duloxetine 60mg  for depression and dysesthesias. She takes medications "occasionally". She feels that depression is most debilitating symptom. 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 She is a at . She used to work 6-2:30. Now she is working 9-5:30. She had a second job but has had to quit due to changes in work schedule. She has tried talking to her supervisor. She does not want to leave [facility]. She has a 48 yr old daughter. She is married. Her husband also suffers from depression.   Solifenacin used for urinary urgency. She is unsure if this is effective.    She reports taking vitamin D supplement daily. Uncertain of dose.    HISTORY (copied from Dr previous note)  Jessica [name] is a 35 y.o. woman with multiple sclerosis.  Update 10/25/2019: She is on Vumerity and tolerates it well.   MRI 04/25/2019 showed multiple T2/FLAIR hyperintense foci in the hemispheres in a pattern and configuration consistent with chronic demyelinating plaque associated with multiple sclerosis. None of the foci enhances or appears to be acute. Compared to the MRI from 04/05/2018, there are no new lesions. She has no exacerbations and denies any new MS symptoms.   Her gait is  doing about the same with slightly reduced balance, noted most if she turns rapidly.   She has some paresthesias in the legs.    She no longer gets Lhermitte sign symptoms.    The numbness is resolved   She has no weakness.   She does not muscles fatigue easily and she feels overall weak.     She has more urinary urgency and has had rare urge incontinence.   She is not sure she gets much benefit from solifenacin.    She is having headaches lasting a few days.  These have been once a month or so.    She does not have nausea or photophobia but had visual changes with the one headache.   Excedrin migraine and other NSAID's are not helping.    Laying down helps some but they do not resolve with sleep.       She had a PSG showing mild OSA with AHI=10   She has lost about 44 pounds and feels she is sleeping better.      She and her husband had Covid 19.      MS History: In March 2015, she had an episode of bilateral leg numbness and right arm numbness and MRI was recommended but she signed out against medical device from the emergency room.  She was numb from the waist down and had a Lhermitte sign.    This lasted 2 months and  completely resolved.   She did not follow-up with neurology.   In mid February 2019, she had the onset of left facial dysesthesia followed by numbness including the tongue she also had some trouble swallowing. She had a scalded sensation in the tongue.    She feels she is about the same.   She feels slightly off with walking but balance is not bad.   She is able to climb stairs without problems.    DMTs:   She started Tecfidera 04/2017 and switched to Vumerity late 2020 due to copays.     Imaging: MRI of the brain dated 04/28/2017 showed multiple T2/flair hyperintense foci in the periventricular, juxtacortical and deep white matter of both hemispheres, with many of the periventricular foci are radially oriented to the ventricles. She also had a focus in the left middle cerebellar  peduncle. None of the foci enhanced.    On the thoracic spine images from 3 years ago, there is a possible T2 level cord focus but study is degraded by movement and interpretation is fairly difficult.      MRI of the brain dated 04/25/2019 showed multiple T2/FLAIR hyperintense foci in the hemispheres in a pattern and configuration consistent with chronic demyelinating plaque associated with multiple sclerosis. None of the foci enhances or appears to be acute. Compared to the MRI from 04/05/2018, there are no new lesions.  Lab work showed a negative ANA, TSH, CBC and CMP.     REVIEW OF SYSTEMS: Out of a complete 14 system review of symptoms, the patient complains only of the following symptoms, fatigue numbness and tingling hands and feet, depression, urinary frequency and urgency and all other reviewed systems are negative.    ALLERGIES: No Known Allergies   HOME MEDICATIONS: Outpatient Medications Prior to Visit  Medication Sig Dispense Refill  . cetirizine (ZYRTEC) 10 MG tablet Take 10 mg by mouth daily.    Marland Kitchen. ibuprofen (ADVIL,MOTRIN) 200 MG tablet Take 400 mg by mouth daily as needed.     Marland Kitchen. levonorgestrel (MIRENA) 20 MCG/24HR IUD 1 each by Intrauterine route once.    Marland Kitchen. VITAMIN D PO Take 1 capsule by mouth daily.     . VUMERITY 231 MG CPDR RX#2 TAKE 2 CAPSULES (462MG ) BY MOUTH TWICE A DAY 120 capsule 5  . buPROPion (WELLBUTRIN XL) 300 MG 24 hr tablet TAKE 1 TABLET (300 MG TOTAL) BY MOUTH DAILY. 90 tablet 3  . DULoxetine (CYMBALTA) 60 MG capsule Take 1 capsule (60 mg total) by mouth daily. 90 capsule 3  . gabapentin (NEURONTIN) 300 MG capsule Take 1 capsule (300 mg total) by mouth 3 (three) times daily. 270 capsule 3  . solifenacin (VESICARE) 10 MG tablet Take 1 tablet (10 mg total) by mouth daily. 90 tablet 3   Facility-Administered Medications Prior to Visit  Medication Dose Route Frequency Provider Last Rate Last Admin  . gadopentetate dimeglumine (MAGNEVIST) injection 20 mL  20  mL Intravenous Once PRN Anson FretAhern, Antonia B, MD         PAST MEDICAL HISTORY: Past Medical History:  Diagnosis Date  . Chronic hip pain   . Closed left arm fracture    age [age]  . MS (multiple sclerosis) (HCC)   . Palpitations   . Scoliosis      PAST SURGICAL HISTORY: Past Surgical History:  Procedure Laterality Date  . adnoids    . CHOLECYSTECTOMY    . NASAL SEPTUM SURGERY    . toe nails removed    .  TONSILLECTOMY       FAMILY HISTORY: Family History  Problem Relation Age of Onset  . Hypertension Mother   . Diabetes Mother        prediabetic  . COPD Mother   . Diabetes Paternal Grandfather   . Hypertension Maternal Grandmother   . Other Maternal Grandmother   . Addison's disease Maternal Grandmother   . Cancer Maternal Grandfather      SOCIAL HISTORY: Social History   Socioeconomic History  . Marital status: Married    Spouse name: Not on file  . Number of children: Not on file  . Years of education: Not on file  . Highest education level: Not on file  Occupational History  . Not on file  Tobacco Use  . Smoking status: Current Every Day Smoker    Packs/day: 1.00    Years: 13.00    Pack years: 13.00    Types: Cigarettes  . Smokeless tobacco: Never Used  Vaping Use  . Vaping Use: Never used  Substance and Sexual Activity  . Alcohol use: Not Currently  . Drug use: Yes    Types: Marijuana    Comment: occ  . Sexual activity: Yes    Birth control/protection: I.U.D.  Other Topics Concern  . Not on file  Social History Narrative  . Not on file   Social Determinants of Health   Financial Resource Strain: Not on file  Food Insecurity: Not on file  Transportation Needs: Not on file  Physical Activity: Not on file  Stress: Not on file  Social Connections: Not on file  Intimate Partner Violence: Not on file      PHYSICAL EXAM  Vitals:   04/23/20 0847  BP: 128/79  Pulse: 76  Weight: 262 lb (118.8 kg)  Height: 5\' 8"  (1.727 m)   Body mass  index is 39.84 kg/m.   Generalized: Well developed, in no acute distress  Cardiology: normal rate and rhythm, no murmur auscultated  Respiratory: clear to auscultation bilaterally    Neurological examination  Mentation: Alert oriented to time, place, history taking. Follows all commands speech and language fluent Cranial nerve II-XII: Pupils were equal round reactive to light. Extraocular movements were full, visual field were full on confrontational test. Facial sensation and strength were normal. Uvula tongue midline. Head turning and shoulder shrug  were normal and symmetric. Motor: The motor testing reveals 5 over 5 strength of all 4 extremities. Good symmetric motor tone is noted throughout.  Sensory: Sensory testing is intact to soft touch on all 4 extremities. No evidence of extinction is noted.  Coordination: Cerebellar testing reveals good finger-nose-finger and heel-to-shin bilaterally.  Gait and station: Gait is normal. Tandem gait is normal. Romberg is negative. No drift is seen.  Reflexes: Deep tendon reflexes are symmetric and normal bilaterally.     DIAGNOSTIC DATA (LABS, IMAGING, TESTING) - I reviewed patient records, labs, notes, testing and imaging myself where available.  Lab Results  Component Value Date   WBC 8.3 10/25/2019   HGB 13.2 10/25/2019   HCT 40.4 10/25/2019   MCV 96 10/25/2019   PLT 236 10/25/2019      Component Value Date/Time   NA 141 10/25/2019 0917   K 4.8 10/25/2019 0917   CL 106 10/25/2019 0917   CO2 27 10/25/2019 0917   GLUCOSE 81 10/25/2019 0917   GLUCOSE 94 03/21/2014 0628   BUN 10 10/25/2019 0917   CREATININE 0.67 10/25/2019 0917   CALCIUM 9.1 10/25/2019 0917   PROT  6.2 10/25/2019 0917   ALBUMIN 4.1 10/25/2019 0917   AST 10 10/25/2019 0917   ALT 10 10/25/2019 0917   ALKPHOS 47 (L) 10/25/2019 0917   BILITOT 0.5 10/25/2019 0917   GFRNONAA 116 10/25/2019 0917   GFRAA 134 10/25/2019 0917   Lab Results  Component Value Date    CHOL 144 10/25/2019   HDL 45 10/25/2019   LDLCALC 88 10/25/2019   TRIG 51 10/25/2019   CHOLHDL 3.2 10/25/2019   No results found for: HGBA1C No results found for: VITAMINB12 Lab Results  Component Value Date   TSH 1.510 10/25/2019    No flowsheet data found.   No flowsheet data found.   ASSESSMENT AND PLAN  35 y.o. year old female  has a past medical history of Chronic hip pain, Closed left arm fracture, MS (multiple sclerosis) (HCC), Palpitations, and Scoliosis. here with   Multiple sclerosis (HCC) - Plan: CBC with Differential/Platelets, CMP  High risk medication use  Vitamin D deficiency - Plan: Vitamin D, 25-hydroxy  Episode of recurrent major depressive disorder, unspecified depression episode severity (HCC) - Plan: Ambulatory referral to Psychiatry  Other fatigue  Dysesthesia affecting both sides of body  Urinary urgency   Jessica [name] reports that she has not taken medications consistently for at least 6-8 months. Fortunately, no new or exacerbating symptoms. She feels that worsening depression contributes to noncompliance. She felt better on bupropion and duloxetine. She denies SI/HI. She has a good support system. I have refilled all medications. We have discussed importance of medication compliance. She feels that she would benefit from meeting with a psychiatrist for chronic depression. Referral placed today. Additional support given in AVS. We have reviewed disease management and progression. She will resume Vumerity as prescribed. I will update labs today. She will continue close follow up with PCP. I will have her return in 4-6 months with Dr Epimenio Foot. She verbalizes understanding and agreement with this plan.     Orders Placed This Encounter  Procedures  . CBC with Differential/Platelets  . CMP  . Vitamin D, 25-hydroxy  . Ambulatory referral to Psychiatry    Referral Priority:   Routine    Referral Type:   Psychiatric    Referral Reason:   Specialty Services  Required    Requested Specialty:   Psychiatry    Number of Visits Requested:   1     Meds ordered this encounter  Medications  . buPROPion (WELLBUTRIN XL) 300 MG 24 hr tablet    Sig: Take 1 tablet (300 mg total) by mouth daily.    Dispense:  90 tablet    Refill:  3    Order Specific Question:   Supervising Provider    Answer:   Anson Fret [id number]  . DULoxetine (CYMBALTA) 60 MG capsule    Sig: Take 1 capsule (60 mg total) by mouth daily.    Dispense:  90 capsule    Refill:  3    Received fax request for refill from pharmacy    Order Specific Question:   Supervising Provider    Answer:   Anson Fret [id number]  . gabapentin (NEURONTIN) 300 MG capsule    Sig: Take 1 capsule (300 mg total) by mouth 3 (three) times daily.    Dispense:  270 capsule    Refill:  3    Order Specific Question:   Supervising Provider    Answer:   Anson Fret [id number]  . solifenacin (VESICARE) 10 MG tablet  Sig: Take 1 tablet (10 mg total) by mouth daily.    Dispense:  90 tablet    Refill:  3    Order Specific Question:   Supervising Provider    Answer:   Anson Fret [id number]      I spent 35 minutes of face-to-face and non-face-to-face time with patient.  This included previsit chart review, lab review, study review, order entry, electronic health record documentation, patient education.    Shawnie Dapper, MSN, FNP-C 04/23/2020, 12:14 PM  [facility] [address] [place], [place] [address] [phone]

## 2020-04-22 ENCOUNTER — Ambulatory Visit: Payer: No Typology Code available for payment source | Admitting: Family Medicine

## 2020-04-23 ENCOUNTER — Encounter: Payer: Self-pay | Admitting: Family Medicine

## 2020-04-23 ENCOUNTER — Other Ambulatory Visit: Payer: Self-pay | Admitting: Family Medicine

## 2020-04-23 ENCOUNTER — Ambulatory Visit: Payer: No Typology Code available for payment source | Admitting: Family Medicine

## 2020-04-23 VITALS — BP 128/79 | HR 76 | Ht 68.0 in | Wt 262.0 lb

## 2020-04-23 DIAGNOSIS — Z79899 Other long term (current) drug therapy: Secondary | ICD-10-CM | POA: Diagnosis not present

## 2020-04-23 DIAGNOSIS — R208 Other disturbances of skin sensation: Secondary | ICD-10-CM

## 2020-04-23 DIAGNOSIS — E559 Vitamin D deficiency, unspecified: Secondary | ICD-10-CM | POA: Diagnosis not present

## 2020-04-23 DIAGNOSIS — G35 Multiple sclerosis: Secondary | ICD-10-CM

## 2020-04-23 DIAGNOSIS — R5383 Other fatigue: Secondary | ICD-10-CM

## 2020-04-23 DIAGNOSIS — R3915 Urgency of urination: Secondary | ICD-10-CM

## 2020-04-23 DIAGNOSIS — F339 Major depressive disorder, recurrent, unspecified: Secondary | ICD-10-CM | POA: Diagnosis not present

## 2020-04-23 MED ORDER — GABAPENTIN 300 MG PO CAPS: 300.0000 mg | ORAL_CAPSULE | Freq: Three times a day (TID) | ORAL | 3 refills | Status: DC

## 2020-04-23 MED ORDER — DULOXETINE HCL 60 MG PO CPEP: 60.0000 mg | ORAL_CAPSULE | Freq: Every day | ORAL | 3 refills | Status: DC

## 2020-04-23 MED ORDER — SOLIFENACIN SUCCINATE 10 MG PO TABS: 10.0000 mg | ORAL_TABLET | Freq: Every day | ORAL | 3 refills | Status: DC

## 2020-04-23 MED ORDER — BUPROPION HCL ER (XL) 300 MG PO TB24: 300.0000 mg | ORAL_TABLET | Freq: Every day | ORAL | 3 refills | Status: DC

## 2020-04-23 MED FILL — SOLIFENACIN SUCCINATE 10 MG: 10 | 90 days supply | Qty: 90 | Fill #0

## 2020-04-23 MED FILL — buPROPion HCL ER (XL) 300 M: 300 | 90 days supply | Qty: 90 | Fill #0

## 2020-04-23 MED FILL — GABAPENTIN 300 MG CAPSULE: 300 | 90 days supply | Qty: 270 | Fill #0

## 2020-04-23 MED FILL — DULoxetine HCL 60 MG CPEP: 60 | 90 days supply | Qty: 90 | Fill #0

## 2020-04-23 NOTE — Progress Notes (Signed)
I have read the note, and I agree with the clinical assessment and plan.  Richard A. Sater, MD, PhD, FAAN Certified in Neurology, Clinical Neurophysiology, Sleep Medicine, Pain Medicine and Neuroimaging  [facility] [address], [address] [place], [place] [address] [phone]  

## 2020-04-23 NOTE — Patient Instructions (Signed)
Below is our plan:  We will restart all of your medications. Please take medications consistently as prescribed. I have referred you to psychiatry. Listen out for a phone call to help you schedule. Please let me know if you need anything.   Please make sure you are staying well hydrated. I recommend 50-60 ounces daily. Well balanced diet and regular exercise encouraged. Consistent sleep schedule with 6-8 hours recommended.   Please continue follow up with care team as directed.   Follow up with Dr Epimenio Foot in 3-4 months   You may receive a survey regarding today's visit. I encourage you to leave honest feed back as I do use this information to improve patient care. Thank you for seeing me today!       Major Depressive Disorder, Adult Major depressive disorder is a mental health condition. This disorder affects feelings. It can also affect the body. Symptoms of this condition last most of the day, almost every day, for 2 weeks. This disorder can affect:  Relationships.  Daily activities, such as work and school.  Activities that you normally like to do. What are the causes? The cause of this condition is not known. The disorder is likely caused by a mix of things, including:  Your personality, such as being a shy person.  Your behavior, or how you act toward others.  Your thoughts and feelings.  Too much alcohol or drugs.  How you react to stress.  Health and mental problems that you have had for a long time.  Things that hurt you in the past (trauma).  Big changes in your life, such as divorce. What increases the risk? The following factors may make you more likely to develop this condition:  Having family members with depression.  Being a woman.  Problems in the family.  Low levels of some brain chemicals.  Things that caused you pain as a child, especially if you lost a parent or were abused.  A lot of stress in your life, such as from: ? Living without basic  needs of life, such as food and shelter. ? Being treated poorly because of race, sex, or religion (discrimination).  Health and mental problems that you have had for a long time. What are the signs or symptoms? The main symptoms of this condition are:  Being sad all the time.  Being grouchy all the time.  Loss of interest in things and activities. Other symptoms include:  Sleeping too much or too little.  Eating too much or too little.  Gaining or losing weight, without knowing why.  Feeling tired or having low energy.  Being restless and weak.  Feeling hopeless, worthless, or guilty.  Trouble thinking clearly or making decisions.  Thoughts of hurting yourself or others, or thoughts of ending your life.  Spending a lot of time alone.  Inability to complete common tasks of daily life. If you have very bad MDD, you may:  Believe things that are not true.  Hear, see, taste, or feel things that are not there.  Have mild depression that lasts for at least 2 years.  Feel very sad and hopeless.  Have trouble speaking or moving. How is this treated? This condition may be treated with:  Talk therapy. This teaches you to know bad thoughts, feelings, and actions and how to change them. ? This can also help you to communicate with others. ? This can be done with members of your family.  Medicines. These can be used to  treat worry (anxiety), depression, or low levels of chemicals in the brain.  Lifestyle changes. You may need to: ? Limit alcohol use. ? Limit drug use. ? Get regular exercise. ? Get plenty of sleep. ? Make healthy eating choices. ? Spend more time outdoors.  Brain stimulation. This treatment excites the brain. This is done when symptoms are very bad or have not gotten better with other treatments. Follow these instructions at home: Activity  Get regular exercise as told.  Spend time outdoors as told.  Make time to do the things you enjoy.  Find  ways to deal with stress. Try to: ? Meditate. ? Do deep breathing. ? Spend time in nature. ? Keep a journal.  Return to your normal activities as told by your doctor. Ask your doctor what activities are safe for you. Alcohol and drug use  If you drink alcohol: ? Limit how much you use to:  0-1 drink a day for women.  0-2 drinks a day for men. ? Be aware of how much alcohol is in your drink. In the [place] one drink equals one 12 oz bottle of beer (355 mL), one 5 oz glass of wine (148 mL), or one 1 oz glass of hard liquor (44 mL).  Talk to your doctor about: ? Alcohol use. Alcohol can affect some medicines. ? Any drug use. General instructions  Take over-the-counter and prescription medicines and herbal preparations only as told by your doctor.  Eat a healthy diet.  Get a lot of sleep.  Think about joining a support group. Your doctor may be able to suggest one.  Keep all follow-up visits as told by your doctor. This is important.   Where to find more information:  [employer] on Mental Illness: www.nami.org  [place] [employer] of Mental Health: http://www.maynard.net/  American Psychiatric Association: www.psychiatry.org/patients-families/ Contact a doctor if:  Your symptoms get worse.  You get new symptoms. Get help right away if:  You hurt yourself.  You have serious thoughts about hurting yourself or others.  You see, hear, taste, smell, or feel things that are not there. If you ever feel like you may hurt yourself or others, or have thoughts about taking your own life, get help right away. Go to your nearest emergency department or:  Call your local emergency services (911 in the [place]  Call a suicide crisis helpline, such as the National Suicide Prevention Lifeline at 440 318 2915. This is open 24 hours a day in the [place]  Text the Crisis Text Line at 810 448 3623 (in the [place] Summary  Major depressive disorder is a mental health condition. This  disorder affects feelings. Symptoms of this condition last most of the day, almost every day, for 2 weeks.  The symptoms of this disorder can cause problems with relationships and with daily activities.  There are treatments and support for people who get this disorder. You may need more than one type of treatment.  Get help right away if you have serious thoughts about hurting yourself or others. This information is not intended to replace advice given to you by your health care provider. Make sure you discuss any questions you have with your health care provider. Document Revised: 01/28/2019 Document Reviewed: 01/28/2019 Elsevier Patient Education  2021 Elsevier Inc.   Multiple Sclerosis Multiple sclerosis (MS) is a disease of the brain, spinal cord, and optic nerves (central nervous system). It causes the body's disease-fighting (immune) system to destroy the protective covering (myelin sheath) around nerves in the  brain. When this happens, signals (nerve impulses) going to and from the brain and spinal cord do not get sent properly or may not get sent at all. There are several types of MS:  Relapsing-remitting MS. This is the most common type. This causes sudden attacks of symptoms. After an attack, you may recover completely until the next attack, or some symptoms may remain permanently.  Secondary progressive MS. This usually develops after the onset of relapsing-remitting MS. Similar to relapsing-remitting MS, this type also causes sudden attacks of symptoms. Attacks may be less frequent, but symptoms slowly get worse (progress) over time.  Primary progressive MS. This causes symptoms that steadily progress over time. This type of MS does not cause sudden attacks of symptoms. The age of onset of MS varies, but it often develops between 73-74 years of age. MS is a lifelong (chronic) condition. There is no cure, but treatment can help slow down the progression of the disease. What are the  causes? The cause of this condition is not known. What increases the risk? You are more likely to develop this condition if:  You are a woman.  You have a relative with MS. However, the condition is not passed from parent to child (inherited).  You have a lack (deficiency) of vitamin D.  You smoke. MS is more common in the [place] than in the [place]. What are the signs or symptoms? Relapsing-remitting and secondary progressive MS cause symptoms to occur in episodes or attacks that may last weeks to months. There may be long periods between attacks in which there are almost no symptoms. Primary progressive MS causes symptoms to steadily progress after they develop. Symptoms of MS vary because of the many different ways it affects the central nervous system. The main symptoms include:  Vision problems and eye pain.  Numbness and weakness.  Inability to move your arms, hands, feet, or legs (paralysis).  Balance problems.  Shaking that you cannot control (tremors).  Muscle spasms.  Problems with thinking (cognitive changes). MS can also cause symptoms that are associated with the disease, but are not always the direct result of an MS attack. They may include:  Inability to control urination or bowel movements (incontinence).  Headaches.  Fatigue.  Inability to tolerate heat.  Emotional changes.  Depression.  Pain. How is this diagnosed? This condition is diagnosed based on:  Your symptoms.  A neurological exam. This involves checking central nervous system function, such as nerve function, reflexes, and coordination.  MRIs of the brain and spinal cord.  Lab tests, including a lumbar puncture that tests the fluid that surrounds the brain and spinal cord (cerebrospinal fluid).  Tests to measure the electrical activity of the brain in response to stimulation (evoked potentials). How is this treated? There is no cure for MS, but  medicines can help decrease the number and frequency of attacks and help relieve nuisance symptoms. Treatment options may include:  Medicines that reduce the frequency of attacks. These medicines may be given by injection, by mouth (orally), or through an IV.  Medicines that reduce inflammation (steroids). These may provide short-term relief of symptoms.  Medicines to help control pain, depression, fatigue, or incontinence.  Nutritional counseling. Vitamin D supplements, if you have a deficiency.  Using devices to help you move around (assistive devices), such as braces, a cane, or a walker.  Physical therapy to strengthen and stretch your muscles.  Occupational therapy to help you with everyday tasks.  Alternative  or complementary treatments such as exercise, massage, or acupuncture.   Follow these instructions at home:  Take over-the-counter and prescription medicines only as told by your health care provider.  Do not drive or use heavy machinery while taking prescription pain medicine.  Use assistive devices as recommended by your physical therapist or your health care provider.  Exercise as directed by your health care provider.  Eating healthy can help manage MS symptoms.  Return to your normal activities as told by your health care provider. Ask your health care provider what activities are safe for you.  Reach out for support. Share your feelings with friends, family, or a support group.  Keep all follow-up visits as told by your health care provider and therapists. This is important. Where to find more information  National Multiple Sclerosis Society: https://www.nationalmssociety.org  [employer] of Neurological Disorders and Stroke: https://johnson-smith.net/  [employer] for Complementary and Integrative Health: http://miller-hamilton.net/ Contact a health care provider if:  You feel depressed.  You develop new pain or numbness.  You have  tremors.  You have problems with sexual function. Get help right away if:  You develop paralysis.  You develop numbness.  You have problems with your bladder or bowel function.  You develop double vision.  You lose vision in one or both eyes.  You develop suicidal thoughts.  You develop severe confusion. If you ever feel like you may hurt yourself or others, or have thoughts about taking your own life, get help right away. You can go to your nearest emergency department or call:  Your local emergency services (911 in the [place]  A suicide crisis helpline, such as the National Suicide Prevention Lifeline at (650) 247-1925. This is open 24 hours a day. Summary  Multiple sclerosis (MS) is a disease of the central nervous system that causes the body's immune system to destroy the protective covering (myelin sheath) around nerves in the brain.  There are 3 types of MS: relapsing-remitting, secondary progressive, and primary progressive. Relapsing-remitting and secondary progressive MS cause symptoms to occur in episodes or attacks that may last weeks to months. Primary progressive MS causes symptoms to steadily progress after they develop.  There is no cure for MS, but medicines can help decrease the number and frequency of attacks and help relieve nuisance symptoms. Treatment may also include physical or occupational therapy.  If you develop numbness, paralysis, vision problems, or other neurological symptoms, get help right away. This information is not intended to replace advice given to you by your health care provider. Make sure you discuss any questions you have with your health care provider. Document Revised: 11/28/2019 Document Reviewed: 11/28/2019 Elsevier Patient Education  2021 [employer].

## 2020-04-24 LAB — CBC WITH DIFFERENTIAL/PLATELET
Basophils Absolute: 0 10*3/uL (ref 0.0–0.2)
Basos: 0 %
EOS (ABSOLUTE): 0.3 10*3/uL (ref 0.0–0.4)
Eos: 2 %
Hematocrit: 44.7 % (ref 34.0–46.6)
Hemoglobin: 14.9 g/dL (ref 11.1–15.9)
Immature Grans (Abs): 0 10*3/uL (ref 0.0–0.1)
Immature Granulocytes: 0 %
Lymphocytes Absolute: 2.1 10*3/uL (ref 0.7–3.1)
Lymphs: 19 %
MCH: 30.5 pg (ref 26.6–33.0)
MCHC: 33.3 g/dL (ref 31.5–35.7)
MCV: 91 fL (ref 79–97)
Monocytes Absolute: 0.7 10*3/uL (ref 0.1–0.9)
Monocytes: 6 %
Neutrophils Absolute: 8.2 10*3/uL — ABNORMAL HIGH (ref 1.4–7.0)
Neutrophils: 73 %
Platelets: 248 10*3/uL (ref 150–450)
RBC: 4.89 x10E6/uL (ref 3.77–5.28)
RDW: 12.1 % (ref 11.7–15.4)
WBC: 11.3 10*3/uL — ABNORMAL HIGH (ref 3.4–10.8)

## 2020-04-24 LAB — COMPREHENSIVE METABOLIC PANEL
ALT: 13 IU/L (ref 0–32)
AST: 12 IU/L (ref 0–40)
Albumin/Globulin Ratio: 2 (ref 1.2–2.2)
Albumin: 4.3 g/dL (ref 3.8–4.8)
Alkaline Phosphatase: 49 IU/L (ref 44–121)
BUN/Creatinine Ratio: 19 (ref 9–23)
BUN: 14 mg/dL (ref 6–20)
Bilirubin Total: 0.6 mg/dL (ref 0.0–1.2)
CO2: 22 mmol/L (ref 20–29)
Calcium: 9 mg/dL (ref 8.7–10.2)
Chloride: 104 mmol/L (ref 96–106)
Creatinine, Ser: 0.73 mg/dL (ref 0.57–1.00)
GFR calc Af Amer: 124 mL/min/{1.73_m2} (ref >59)
GFR calc non Af Amer: 108 mL/min/{1.73_m2} (ref >59)
Globulin, Total: 2.2 g/dL (ref 1.5–4.5)
Glucose: 80 mg/dL (ref 65–99)
Potassium: 5 mmol/L (ref 3.5–5.2)
Sodium: 141 mmol/L (ref 134–144)
Total Protein: 6.5 g/dL (ref 6.0–8.5)

## 2020-04-24 LAB — VITAMIN D 25 HYDROXY (VIT D DEFICIENCY, FRACTURES): Vit D, 25-Hydroxy: 35.5 ng/mL (ref 30.0–100.0)

## 2020-05-24 ENCOUNTER — Other Ambulatory Visit (HOSPITAL_COMMUNITY): Payer: Self-pay | Admitting: Physician Assistant

## 2020-05-24 MED FILL — traZODone HCL 50 MG TABS: 50 | 30 days supply | Qty: 60 | Fill #0

## 2020-07-17 ENCOUNTER — Other Ambulatory Visit: Payer: Self-pay

## 2020-07-17 ENCOUNTER — Encounter: Payer: Self-pay | Admitting: Emergency Medicine

## 2020-07-17 ENCOUNTER — Ambulatory Visit
Admission: EM | Admit: 2020-07-17 | Discharge: 2020-07-17 | Disposition: A | Discharge status: Home / Self Care | Payer: No Typology Code available for payment source | Attending: Family Medicine | Admitting: Family Medicine

## 2020-07-17 DIAGNOSIS — M542 Cervicalgia: Secondary | ICD-10-CM

## 2020-07-17 DIAGNOSIS — M5412 Radiculopathy, cervical region: Secondary | ICD-10-CM

## 2020-07-17 MED ORDER — CYCLOBENZAPRINE HCL 10 MG PO TABS: 10.0000 mg | ORAL_TABLET | Freq: Two times a day (BID) | ORAL | 0 refills | Status: DC | PRN

## 2020-07-17 MED ORDER — PREDNISONE 10 MG (21) PO TBPK: ORAL_TABLET | Freq: Every day | ORAL | 0 refills | Status: AC

## 2020-07-17 MED ORDER — DEXAMETHASONE SODIUM PHOSPHATE 10 MG/ML IJ SOLN
10.0000 mg | Freq: Once | INTRAMUSCULAR | Status: AC
  Administered 2020-07-17: 10 mg via INTRAMUSCULAR

## 2020-07-17 MED ORDER — KETOROLAC TROMETHAMINE 30 MG/ML IJ SOLN
30.0000 mg | Freq: Once | INTRAMUSCULAR | Status: AC
  Administered 2020-07-17: 30 mg via INTRAMUSCULAR

## 2020-07-17 NOTE — ED Triage Notes (Signed)
Neck pain that radiates down LT arm ,  Limited neck movement that started yesterday. Pt had this problem years ago and was told she had a vertebrae issue in her neck.

## 2020-07-17 NOTE — Discharge Instructions (Signed)
You have received Decadron in the office as a steroid to help with inflammation  You received Toradol in the office today for pain  I have sent in a prednisone taper for you to take for 6 days. 6 tablets on day one, 5 tablets on day two, 4 tablets on day three, 3 tablets on day four, 2 tablets on day five, and 1 tablet on day six.  I have sent in flexeril for you to take twice a day as needed for muscle spasms. This medication can make you sleepy. Do not drive or operate heavy machinery with this medication.  Follow up with this office or with primary care if symptoms are persisting.  Follow up in the ER for high fever, trouble swallowing, trouble breathing, other concerning symptoms.

## 2020-07-17 NOTE — ED Provider Notes (Signed)
[facility]    CSN: [id number] Arrival date & time: 07/17/20  0856      History   Chief Complaint Chief Complaint  Patient presents with  . Torticollis    HPI Jessica [name] is a 35 y.o. female.   Reports left neck pain that radiates down the left arm.  States that her neck assist if she cannot turn her head.  Has taken ibuprofen and Tylenol at home with little relief.  States that she had this problem years ago and was told that she had a vertebrae issue in her neck.  She thinks that she may have aggravated this.  Denies fever, chills, headache, abdominal pain, nausea, vomiting, rash, other symptoms.  ROS per HPI  The history is provided by the patient.    Past Medical History:  Diagnosis Date  . Chronic hip pain   . Closed left arm fracture    age [age]  . MS (multiple sclerosis) (HCC)   . Palpitations   . Scoliosis     Patient Active Problem List   Diagnosis Date Noted  . OSA (obstructive sleep apnea) 10/25/2019  . High risk medication use 10/25/2019  . Nonintractable episodic headache 10/25/2019  . BV (bacterial vaginosis) 12/23/2017  . Encounter for IUD insertion 12/23/2017  . Depression 09/08/2017  . Encounter for smoking cessation counseling 09/08/2017  . Multiple sclerosis (HCC) 05/04/2017  . Facial numbness 05/04/2017  . Transverse myelitis (HCC) 05/04/2017  . Ataxia 05/04/2017  . Urinary urgency 05/04/2017  . Neurosensory deficit 05/09/2013  . Leg weakness, bilateral 05/09/2013  . OBESITY, MORBID 10/22/2009  . ULNAR NEUROPATHY 10/22/2009  . CHOLELITHIASIS 10/22/2009    Past Surgical History:  Procedure Laterality Date  . adnoids    . CHOLECYSTECTOMY    . NASAL SEPTUM SURGERY    . toe nails removed    . TONSILLECTOMY      OB History    Gravida  2   Para  1   Term  1   Preterm      AB  1   Living  1     SAB  1   IAB      Ectopic      Multiple      Live Births  1            Home Medications     Prior to Admission medications   Medication Sig Start Date End Date Taking? Authorizing Provider  cyclobenzaprine (FLEXERIL) 10 MG tablet Take 1 tablet (10 mg total) by mouth 2 (two) times daily as needed for muscle spasms. 07/17/20  Yes Moshe Cipro, NP  predniSONE (STERAPRED UNI-PAK 21 TAB) 10 MG (21) TBPK tablet Take by mouth daily for 6 days. Take 6 tablets on day 1, 5 tablets on day 2, 4 tablets on day 3, 3 tablets on day 4, 2 tablets on day 5, 1 tablet on day 6 07/17/20 07/23/20 Yes Moshe Cipro, NP  buPROPion (WELLBUTRIN XL) 300 MG 24 hr tablet TAKE 1 TABLET (300 MG TOTAL) BY MOUTH DAILY. 04/23/20 04/23/21  Lomax, Amy, NP  cetirizine (ZYRTEC) 10 MG tablet Take 10 mg by mouth daily.    [provider]  DULoxetine (CYMBALTA) 60 MG capsule TAKE 1 CAPSULE (60 MG TOTAL) BY MOUTH DAILY. 04/23/20 04/23/21  Lomax, Amy, NP  gabapentin (NEURONTIN) 300 MG capsule TAKE 1 CAPSULE (300 MG TOTAL) BY MOUTH 3 (THREE) TIMES DAILY. 04/23/20 04/23/21  Lomax, Amy, NP  ibuprofen (ADVIL,MOTRIN) 200 MG tablet  Take 400 mg by mouth daily as needed.     [provider]  levonorgestrel (MIRENA) 20 MCG/24HR IUD 1 each by Intrauterine route once.    [provider]  solifenacin (VESICARE) 10 MG tablet TAKE 1 TABLET (10 MG TOTAL) BY MOUTH DAILY. 04/23/20 04/23/21  Shawnie Dapper, NP  traZODone (DESYREL) 50 MG tablet TAKE 1-2 TABLETS BY MOUTH AT BEDTIME, AS NEEDED FOR SLEEP 05/24/20 05/24/21  Jocelyn Lamer, PA  VITAMIN D PO Take 1 capsule by mouth daily.     [provider]  VUMERITY 231 MG CPDR RX#2 TAKE 2 CAPSULES (462MG ) BY MOUTH TWICE A DAY 01/29/20   Sater, 01/31/20, MD    Family History Family History  Problem Relation Age of Onset  . Hypertension Mother   . Diabetes Mother        prediabetic  . COPD Mother   . Diabetes Paternal Grandfather   . Hypertension Maternal Grandmother   . Other Maternal Grandmother   . Addison's disease Maternal Grandmother   . Cancer Maternal  Grandfather     Social History Social History   Tobacco Use  . Smoking status: Current Every Day Smoker    Packs/day: 1.00    Years: 13.00    Pack years: 13.00    Types: Cigarettes  . Smokeless tobacco: Never Used  Vaping Use  . Vaping Use: Never used  Substance Use Topics  . Alcohol use: Not Currently  . Drug use: Yes    Types: Marijuana    Comment: occ     Allergies   Patient has no known allergies.   Review of Systems Review of Systems   Physical Exam Triage Vital Signs ED Triage Vitals  Enc Vitals Group     BP 07/17/20 0915 116/80     Pulse Rate 07/17/20 0915 60     Resp 07/17/20 0915 16     Temp 07/17/20 0915 98.6 F (37 C)     Temp Source 07/17/20 0915 Oral     SpO2 07/17/20 0915 98 %     Weight --      Height --      Head Circumference --      Peak Flow --      Pain Score 07/17/20 0925 7     Pain Loc --      Pain Edu? --      Excl. in GC? --    No data found.  Updated Vital Signs BP 116/80 (BP Location: Right Arm)   Pulse 60   Temp 98.6 F (37 C) (Oral)   Resp 16   SpO2 98%       Physical Exam Vitals and nursing note reviewed.  Constitutional:      General: She is not in acute distress.    Appearance: She is well-developed. She is not ill-appearing.  HENT:     Head: Normocephalic and atraumatic.     Nose: Nose normal.     Mouth/Throat:     Mouth: Mucous membranes are moist.     Pharynx: Oropharynx is clear.  Eyes:     Extraocular Movements: Extraocular movements intact.     Conjunctiva/sclera: Conjunctivae normal.     Pupils: Pupils are equal, round, and reactive to light.  Neck:     Comments: Limited ROM Cardiovascular:     Rate and Rhythm: Normal rate and regular rhythm.  Pulmonary:     Effort: Pulmonary effort is normal.  Musculoskeletal:  General: Swelling and tenderness present.     Cervical back: Neck supple. No tenderness.     Comments: Left trapezius and spasm on palpation, tender to touch  Skin:    General:  Skin is warm and dry.     Capillary Refill: Capillary refill takes less than 2 seconds.  Neurological:     General: No focal deficit present.     Mental Status: She is alert and oriented to person, place, and time.  Psychiatric:        Mood and Affect: Mood normal.        Behavior: Behavior normal.        Thought Content: Thought content normal.      UC Treatments / Results  Labs (all labs ordered are listed, but only abnormal results are displayed) Labs Reviewed - No data to display  EKG   Radiology No results found.  Procedures Procedures (including critical care time)  Medications Ordered in UC Medications  ketorolac (TORADOL) 30 MG/ML injection 30 mg (has no administration in time range)  dexamethasone (DECADRON) injection 10 mg (has no administration in time range)    Initial Impression / Assessment and Plan / UC Course  I have reviewed the triage vital signs and the nursing notes.  Pertinent labs & imaging results that were available during my care of the patient were reviewed by me and considered in my medical decision making (see chart for details).    Cervical radiculopathy Left neck pain  Toradol 30 mg IM given in office today Decadron 10 mg IM given in office today Prednisone taper prescribed Flexeril prescribed Sedation precautions given Follow up with this office or with primary care if symptoms are persisting.  Follow up in the ER for high fever, trouble swallowing, trouble breathing, other concerning symptoms.   Final Clinical Impressions(s) / UC Diagnoses   Final diagnoses:  Neck pain on left side  Cervical radiculopathy     Discharge Instructions     You have received Decadron in the office as a steroid to help with inflammation  You received Toradol in the office today for pain  I have sent in a prednisone taper for you to take for 6 days. 6 tablets on day one, 5 tablets on day two, 4 tablets on day three, 3 tablets on day four, 2  tablets on day five, and 1 tablet on day six.  I have sent in flexeril for you to take twice a day as needed for muscle spasms. This medication can make you sleepy. Do not drive or operate heavy machinery with this medication.  Follow up with this office or with primary care if symptoms are persisting.  Follow up in the ER for high fever, trouble swallowing, trouble breathing, other concerning symptoms.     ED Prescriptions    Medication Sig Dispense Auth. Provider   predniSONE (STERAPRED UNI-PAK 21 TAB) 10 MG (21) TBPK tablet Take by mouth daily for 6 days. Take 6 tablets on day 1, 5 tablets on day 2, 4 tablets on day 3, 3 tablets on day 4, 2 tablets on day 5, 1 tablet on day 6 21 tablet Moshe Cipro, NP   cyclobenzaprine (FLEXERIL) 10 MG tablet Take 1 tablet (10 mg total) by mouth 2 (two) times daily as needed for muscle spasms. 20 tablet Moshe Cipro, NP     PDMP not reviewed this encounter.   Moshe Cipro, NP 07/17/20 1000

## 2020-08-22 ENCOUNTER — Encounter: Payer: Self-pay | Admitting: Neurology

## 2020-08-22 ENCOUNTER — Ambulatory Visit: Payer: No Typology Code available for payment source | Admitting: Neurology

## 2020-09-22 ENCOUNTER — Other Ambulatory Visit: Payer: Self-pay | Admitting: Neurology

## 2020-09-22 DIAGNOSIS — G35 Multiple sclerosis: Secondary | ICD-10-CM

## 2020-10-01 ENCOUNTER — Ambulatory Visit (INDEPENDENT_AMBULATORY_CARE_PROVIDER_SITE_OTHER): Payer: No Typology Code available for payment source | Admitting: Neurology

## 2020-10-01 ENCOUNTER — Other Ambulatory Visit: Payer: Self-pay

## 2020-10-01 ENCOUNTER — Encounter: Payer: Self-pay | Admitting: Neurology

## 2020-10-01 VITALS — BP 136/82 | HR 67 | Ht 68.0 in | Wt 268.5 lb

## 2020-10-01 DIAGNOSIS — R29898 Other symptoms and signs involving the musculoskeletal system: Secondary | ICD-10-CM | POA: Diagnosis not present

## 2020-10-01 DIAGNOSIS — G35 Multiple sclerosis: Secondary | ICD-10-CM | POA: Diagnosis not present

## 2020-10-01 DIAGNOSIS — Z79899 Other long term (current) drug therapy: Secondary | ICD-10-CM | POA: Diagnosis not present

## 2020-10-01 DIAGNOSIS — R3915 Urgency of urination: Secondary | ICD-10-CM | POA: Diagnosis not present

## 2020-10-01 NOTE — Progress Notes (Signed)
[facility]  PATIENT: Jessica [name] DOB: 1985-06-29  REFERRING DOCTOR OR PCP:  Dr. Lucia Gaskins.   PCP is Social research officer, government SOURCE: Patient, notes from Dr. Lucia Gaskins, imaging and lab results, MRI images on PACS personally reviewed.  _________________________________   HISTORICAL  CHIEF COMPLAINT:  Chief Complaint  Patient presents with   Follow-up    RM 1, alone. Last seen 04/23/20 AL,NP. On vumerity for MS. Went to urgent care a couple months ago d/t muscle pain on L side of neck. Given flexeril prn and it helped but out of refills. Wanting to know if Dr. Epimenio Foot will rx.     HISTORY OF PRESENT ILLNESS:  Jessica [name] is a 35 y.o. woman with multiple sclerosis.  Update 10/01/2020: She is  back on Vumerity and tolerates it well.  She has missed doses and we discussed importance of compliance.   MRI 04/25/2019 brain was stable compared to 04/05/2018.   She has no exacerbations and denies any new MS symptoms.     Her gait is about the same with some stumbles but no falls.   Balance is mildly off.    She no longer gets Lhermitte sign symptoms.    However she gets some pain in her neck that seems to radiate into the left arm.   She went to urgent care and was prescribed steroid and a muscle relaxant with transient benefit.   She has no weakness.   She does not muscles fatigue easily and she feels overall weak.     She has more urinary urgency and has had rare urge incontinence.   She had some benefit from solifenacin.    She is having fewer headaches  She does not have nausea or photophobia but had visual changes with the one headache.   Excedrin migraine and other NSAID's are not helping.    Laying down helps some but they do not resolve with sleep.       She had a PSG showing mild OSA with AHI=10   She has lost about 44 pounds and feels she is sleeping better.  Marland Kitchen However, she regained some weight recently.       She and her husband had Covid 19.      Cervical CT  in 2016 showed C4C5 DJD.  MS History: In March 2015, she had an episode of bilateral leg numbness and right arm numbness and MRI was recommended but she signed out against medical device from the emergency room.  She was numb from the waist down and had a Lhermitte sign.    This lasted 2 months and completely resolved.   She did not follow-up with neurology.   In mid February 2019, she had the onset of left facial dysesthesia followed by numbness including the tongue she also had some trouble swallowing. She had a scalded sensation in the tongue.    She feels she is about the same.   She feels slightly off with walking but balance is not bad.   She is able to climb stairs without problems.    DMTs:   She started Tecfidera 04/2017 and switched to Vumerity late 2020 due to copays.     Imaging: MRI of the brain dated 04/28/2017 showed multiple T2/flair hyperintense foci in the periventricular, juxtacortical and deep white matter of both hemispheres, with many of the periventricular foci are radially oriented to the ventricles. She also had a focus in the left middle cerebellar peduncle. None of the foci enhanced.  On the thoracic spine images from 3 years ago, there is a possible T2 level cord focus but study is degraded by movement and interpretation is fairly difficult.      MRI of the brain dated 04/25/2019 showed multiple T2/FLAIR hyperintense foci in the hemispheres in a pattern and configuration consistent with chronic demyelinating plaque associated with multiple sclerosis.  None of the foci enhances or appears to be acute.  Compared to the MRI from 04/05/2018, there are no new lesions.  Lab work showed a negative ANA, TSH, CBC and CMP.      REVIEW OF SYSTEMS: Constitutional: No fevers, chills, sweats, or change in appetite Eyes: No visual changes, double vision, eye pain Ear, nose and throat: No hearing loss, ear pain, nasal congestion, sore throat Cardiovascular: No chest pain,  palpitations Respiratory:  No shortness of breath at rest or with exertion.   No wheezes GastrointestinaI: No nausea, vomiting, diarrhea, abdominal pain, fecal incontinence Genitourinary:  No dysuria, urinary retention or frequency.  No nocturia. Musculoskeletal:  No neck pain, back pain Integumentary: No rash, pruritus, skin lesions Neurological: as above Psychiatric: No depression at this time.  No anxiety Endocrine: No palpitations, diaphoresis, change in appetite, change in weigh or increased thirst Hematologic/Lymphatic:  No anemia, purpura, petechiae. Allergic/Immunologic: No itchy/runny eyes, nasal congestion, recent allergic reactions, rashes  ALLERGIES: No Known Allergies  HOME MEDICATIONS:  Current Outpatient Medications:    buPROPion (WELLBUTRIN XL) 300 MG 24 hr tablet, TAKE 1 TABLET (300 MG TOTAL) BY MOUTH DAILY., Disp: 90 tablet, Rfl: 3   cetirizine (ZYRTEC) 10 MG tablet, Take 10 mg by mouth daily., Disp: , Rfl:    DULoxetine (CYMBALTA) 60 MG capsule, TAKE 1 CAPSULE (60 MG TOTAL) BY MOUTH DAILY., Disp: 90 capsule, Rfl: 3   gabapentin (NEURONTIN) 300 MG capsule, TAKE 1 CAPSULE (300 MG TOTAL) BY MOUTH 3 (THREE) TIMES DAILY., Disp: 270 capsule, Rfl: 3   ibuprofen (ADVIL,MOTRIN) 200 MG tablet, Take 400 mg by mouth daily as needed. , Disp: , Rfl:    levonorgestrel (MIRENA) 20 MCG/24HR IUD, 1 each by Intrauterine route once., Disp: , Rfl:    solifenacin (VESICARE) 10 MG tablet, TAKE 1 TABLET (10 MG TOTAL) BY MOUTH DAILY., Disp: 90 tablet, Rfl: 3   VITAMIN D PO, Take 1 capsule by mouth daily. , Disp: , Rfl:    VUMERITY 231 MG CPDR, TAKE 2 CAPSULES (462MG ) BY MOUTH TWICE A DAY, Disp: 120 capsule, Rfl: 5   cyclobenzaprine (FLEXERIL) 10 MG tablet, Take 1 tablet (10 mg total) by mouth 2 (two) times daily as needed for muscle spasms. (Patient not taking: Reported on 10/01/2020), Disp: 20 tablet, Rfl: 0 No current facility-administered medications for this visit.  Facility-Administered  Medications Ordered in Other Visits:    gadopentetate dimeglumine (MAGNEVIST) injection 20 mL, 20 mL, Intravenous, Once PRN, 12/01/2020, MD  PAST MEDICAL HISTORY: Past Medical History:  Diagnosis Date   Chronic hip pain    Closed left arm fracture    [age]   MS (multiple sclerosis) (HCC)    Palpitations    Scoliosis     PAST SURGICAL HISTORY: Past Surgical History:  Procedure Laterality Date   adnoids     CHOLECYSTECTOMY     NASAL SEPTUM SURGERY     toe nails removed     TONSILLECTOMY      FAMILY HISTORY: Family History  Problem Relation Age of Onset   Hypertension Mother    Diabetes Mother  prediabetic   COPD Mother    Diabetes Paternal Grandfather    Hypertension Maternal Grandmother    Other Maternal Grandmother    Addison's disease Maternal Grandmother    Cancer Maternal Grandfather     SOCIAL HISTORY:  Social History   Socioeconomic History   Marital status: Married    Spouse name: Not on file   Number of children: Not on file   Years of education: Not on file   Highest education level: Not on file  Occupational History   Not on file  Tobacco Use   Smoking status: Every Day    Packs/day: 1.00    Years: 13.00    Pack years: 13.00    Types: Cigarettes   Smokeless tobacco: Never  Vaping Use   Vaping Use: Never used  Substance and Sexual Activity   Alcohol use: Not Currently   Drug use: Yes    Types: Marijuana    Comment: occ   Sexual activity: Yes    Birth control/protection: I.U.D.  Other Topics Concern   Not on file  Social History Narrative   Not on file   Social Determinants of Health   Financial Resource Strain: Not on file  Food Insecurity: Not on file  Transportation Needs: Not on file  Physical Activity: Not on file  Stress: Not on file  Social Connections: Not on file  Intimate Partner Violence: Not on file     PHYSICAL EXAM  Vitals:   10/01/20 1507  BP: 136/82  Pulse: 67  Weight: 268 lb 8 oz (121.8  kg)  Height: 5\' 8"  (1.727 m)    Body mass index is 40.83 kg/m.   General: The patient is well-developed and well-nourished and in no acute distress.   Sclera are anicteric.   Neck with good ROM.  Mild tenderness left occiput.    Neurologic Exam  Mental status: The patient is alert and oriented x 3 at the time of the examination. The patient has apparent normal recent and remote memory, with an apparently normal attention span and concentration ability.   Speech is normal.  Cranial nerves: Extraocular movements are full.   Color vision was symmetric.  Facial strength and sensation is normal.  Trapezius strength is normal.  No dysarthria is noted.    No obvious hearing deficits are noted.  Motor:  Muscle bulk is normal.   Tone is normal. Strength is  5 / 5 in all 4 extremities. Rapid alternating movements are performed normally  Sensory: Sensory testing is intact to soft touch and vibration sensation in all 4 extremities.  Coordination: Cerebellar testing reveals good finger-nose-finger and heel-to-shin is now normal and symmetric  Gait and station: Station is normal.   She has a normal gait and tandem gait. . Romberg is negative  Reflexes: Deep tendon reflexes are symmetric and normal bilaterally in the arms and increased at the knees with spread, left > right    There is no ankle clonus.    DIAGNOSTIC DATA (LABS, IMAGING, TESTING) - I reviewed patient records, labs, notes, testing and imaging myself where available.  Lab Results  Component Value Date   WBC 11.3 (H) 04/23/2020   HGB 14.9 04/23/2020   HCT 44.7 04/23/2020   MCV 91 04/23/2020   PLT 248 04/23/2020      Component Value Date/Time   NA 141 04/23/2020 0943   K 5.0 04/23/2020 0943   CL 104 04/23/2020 0943   CO2 22 04/23/2020 0943   GLUCOSE 80 04/23/2020  0943   GLUCOSE 94 03/21/2014 0628   BUN 14 04/23/2020 0943   CREATININE 0.73 04/23/2020 0943   CALCIUM 9.0 04/23/2020 0943   PROT 6.5 04/23/2020 0943    ALBUMIN 4.3 04/23/2020 0943   AST 12 04/23/2020 0943   ALT 13 04/23/2020 0943   ALKPHOS 49 04/23/2020 0943   BILITOT 0.6 04/23/2020 0943   GFRNONAA 108 04/23/2020 0943   GFRAA 124 04/23/2020 0943    Lab Results  Component Value Date   TSH 1.510 10/25/2019      ASSESSMENT AND PLAN  1. Multiple sclerosis (HCC)   2. Urinary urgency   3. Leg weakness, bilateral   4. High risk medication use      1.     Continue Vumerity check CBC with differential, CMP   Check brain MRi and cervical spine MRI to determine if any subclinical progression and consider a different DMT if changes. 2.     Continue Wellbutrin Xl and Cymbalta for depression.  3.      Continue Vesicare for urinary dysfunction 4.     She has mild OSA. Advised to try to lose weight recently gained.   5.     Return in 6 months or sooner if there are new or worsening neurologic symptoms.   Brax Walen A. Epimenio Foot, MD, [facility] 10/01/2020, 3:58 PM Certified in Neurology, Clinical Neurophysiology, Sleep Medicine, Pain Medicine and Neuroimaging  [facility] [address], [address] [place], [place] [address] [phone]

## 2020-10-02 ENCOUNTER — Telehealth: Payer: Self-pay | Admitting: Neurology

## 2020-10-02 LAB — CBC WITH DIFFERENTIAL/PLATELET
Basophils Absolute: 0 10*3/uL (ref 0.0–0.2)
Basos: 0 %
EOS (ABSOLUTE): 0.2 10*3/uL (ref 0.0–0.4)
Eos: 1 %
Hematocrit: 42.7 % (ref 34.0–46.6)
Hemoglobin: 13.7 g/dL (ref 11.1–15.9)
Immature Grans (Abs): 0 10*3/uL (ref 0.0–0.1)
Immature Granulocytes: 0 %
Lymphocytes Absolute: 2.8 10*3/uL (ref 0.7–3.1)
Lymphs: 26 %
MCH: 30.2 pg (ref 26.6–33.0)
MCHC: 32.1 g/dL (ref 31.5–35.7)
MCV: 94 fL (ref 79–97)
Monocytes Absolute: 0.6 10*3/uL (ref 0.1–0.9)
Monocytes: 6 %
Neutrophils Absolute: 7.1 10*3/uL — ABNORMAL HIGH (ref 1.4–7.0)
Neutrophils: 67 %
Platelets: 265 10*3/uL (ref 150–450)
RBC: 4.54 x10E6/uL (ref 3.77–5.28)
RDW: 13.1 % (ref 11.7–15.4)
WBC: 10.7 10*3/uL (ref 3.4–10.8)

## 2020-10-02 LAB — HEPATIC FUNCTION PANEL
ALT: 14 IU/L (ref 0–32)
AST: 12 IU/L (ref 0–40)
Albumin: 4.1 g/dL (ref 3.8–4.8)
Alkaline Phosphatase: 49 IU/L (ref 44–121)
Bilirubin Total: 0.4 mg/dL (ref 0.0–1.2)
Bilirubin, Direct: <0.1 mg/dL (ref 0.00–0.40)
Total Protein: 6.4 g/dL (ref 6.0–8.5)

## 2020-10-02 NOTE — Telephone Encounter (Signed)
MRI brain w/wo contrast & cervical spine w/wo contrast [facility] focus auth: [id number] (10/02/20-11/01/20)  Sent to GI for scheduling

## 2020-10-20 ENCOUNTER — Other Ambulatory Visit: Payer: No Typology Code available for payment source

## 2020-10-22 ENCOUNTER — Other Ambulatory Visit: Payer: Self-pay

## 2020-10-22 ENCOUNTER — Ambulatory Visit
Admission: RE | Admit: 2020-10-22 | Discharge: 2020-10-22 | Disposition: A | Discharge status: Home / Self Care | Payer: No Typology Code available for payment source | Source: Ambulatory Visit | Attending: Neurology | Admitting: Neurology

## 2020-10-22 DIAGNOSIS — R3915 Urgency of urination: Secondary | ICD-10-CM

## 2020-10-22 DIAGNOSIS — R29898 Other symptoms and signs involving the musculoskeletal system: Secondary | ICD-10-CM

## 2020-10-22 DIAGNOSIS — G35 Multiple sclerosis: Secondary | ICD-10-CM

## 2020-10-22 MED ORDER — GADOBENATE DIMEGLUMINE 529 MG/ML IV SOLN
20.0000 mL | Freq: Once | INTRAVENOUS | Status: AC | PRN
  Administered 2020-10-22: 20 mL via INTRAVENOUS

## 2020-11-07 ENCOUNTER — Other Ambulatory Visit (HOSPITAL_COMMUNITY): Payer: Self-pay

## 2020-11-07 MED ORDER — METHOCARBAMOL 500 MG PO TABS
1000.0000 mg | ORAL_TABLET | ORAL | 2 refills | Status: DC
  Filled 2020-11-07: qty 60, 10d supply, fill #0
  Filled 2020-12-13: qty 60, 10d supply, fill #1
  Filled 2021-02-06: qty 60, 10d supply, fill #2

## 2020-11-20 ENCOUNTER — Encounter: Payer: Self-pay | Admitting: Radiology

## 2020-12-13 ENCOUNTER — Other Ambulatory Visit (HOSPITAL_COMMUNITY): Payer: Self-pay

## 2020-12-13 MED FILL — Bupropion HCl Tab ER 24HR 300 MG: ORAL | 90 days supply | Qty: 90 | Fill #0 | Status: AC

## 2020-12-13 MED FILL — Gabapentin Cap 300 MG: ORAL | 90 days supply | Qty: 270 | Fill #0 | Status: AC

## 2020-12-13 MED FILL — Solifenacin Succinate Tab 10 MG: ORAL | 90 days supply | Qty: 90 | Fill #0 | Status: AC

## 2020-12-13 MED FILL — Duloxetine HCl Enteric Coated Pellets Cap 60 MG (Base Eq): ORAL | 90 days supply | Qty: 90 | Fill #0 | Status: AC

## 2021-02-06 ENCOUNTER — Other Ambulatory Visit (HOSPITAL_COMMUNITY): Payer: Self-pay

## 2021-03-26 ENCOUNTER — Emergency Department (HOSPITAL_COMMUNITY): Payer: PRIVATE HEALTH INSURANCE

## 2021-03-26 ENCOUNTER — Emergency Department (HOSPITAL_COMMUNITY)
Admission: EM | Admit: 2021-03-26 | Discharge: 2021-03-26 | Disposition: A | Discharge status: Home / Self Care | Payer: PRIVATE HEALTH INSURANCE | Attending: Emergency Medicine | Admitting: Emergency Medicine

## 2021-03-26 ENCOUNTER — Encounter (HOSPITAL_COMMUNITY): Payer: Self-pay

## 2021-03-26 ENCOUNTER — Other Ambulatory Visit: Payer: Self-pay

## 2021-03-26 DIAGNOSIS — S6991XA Unspecified injury of right wrist, hand and finger(s), initial encounter: Secondary | ICD-10-CM | POA: Diagnosis present

## 2021-03-26 DIAGNOSIS — M7989 Other specified soft tissue disorders: Secondary | ICD-10-CM | POA: Insufficient documentation

## 2021-03-26 DIAGNOSIS — Y990 Civilian activity done for income or pay: Secondary | ICD-10-CM | POA: Insufficient documentation

## 2021-03-26 DIAGNOSIS — S63641A Sprain of metacarpophalangeal joint of right thumb, initial encounter: Secondary | ICD-10-CM | POA: Insufficient documentation

## 2021-03-26 DIAGNOSIS — X500XXA Overexertion from strenuous movement or load, initial encounter: Secondary | ICD-10-CM | POA: Insufficient documentation

## 2021-03-26 MED ORDER — IBUPROFEN 600 MG PO TABS: 600.0000 mg | ORAL_TABLET | Freq: Three times a day (TID) | ORAL | 0 refills | Status: AC

## 2021-03-26 NOTE — Discharge Instructions (Signed)
Wear the splint anytime you are doing any significant work with your right hand as he needs to protect this thumb joint as it continues to heal.  Ice and elevation can also help with pain and swelling.  Use the ibuprofen as prescribed.  Plan to follow-up with orthopedics for recheck of your injury and to help determine when he can safely return to work or if you need any further testing if your symptoms are not improving with this treatment plan.

## 2021-03-26 NOTE — ED Triage Notes (Signed)
Patient states she was working and her right thumb started hurting and presents today with increased pain. Patient was working in [employer] when her thumb started hurting. Denies numbness.

## 2021-03-26 NOTE — ED Notes (Signed)
X-ray at bedside

## 2021-03-26 NOTE — ED Notes (Signed)
Pt d/c home per MD order .discharge summary reviewed, pt verbalizes understanding. Ambulatory . No s/s of acute distress noted at discharge.

## 2021-03-26 NOTE — ED Provider Notes (Signed)
[facility] EMERGENCY DEPARTMENT Provider Note   CSN: [id number] Arrival date & time: 03/26/21  1126     History  Chief Complaint  Patient presents with   Hand Pain    Jessica [name] is a 36 y.o. female , right handed, presenting with acute pain at the base of her right thumb, present for the past 5 days.  She describes reaching for a tray handle to lift something out of the grill oven (she works in [employer]) when she had sudden onset of pain in the thumb which has been very painful since with any attempts at lifting objects and movement of the thumb.  She denies numbness in the finger and there is no radiation of pain.  She has tried ibuprofen without improvement in pain. Denies prior injury at this site.  The history is provided by the patient.      Home Medications Prior to Admission medications   Medication Sig Start Date End Date Taking? Authorizing Provider  ibuprofen (ADVIL) 600 MG tablet Take 1 tablet (600 mg total) by mouth 3 (three) times daily. 03/26/21  Yes Yarethzi Branan, Raynelle Fanning, PA-C  buPROPion (WELLBUTRIN XL) 300 MG 24 hr tablet Take 1 tablet (300 mg total) by mouth daily. 04/23/20   Lomax, Amy, NP  cetirizine (ZYRTEC) 10 MG tablet Take 10 mg by mouth daily.    [provider]  cyclobenzaprine (FLEXERIL) 10 MG tablet Take 1 tablet (10 mg total) by mouth 2 (two) times daily as needed for muscle spasms. Patient not taking: Reported on 10/01/2020 07/17/20   Moshe Cipro, NP  DULoxetine (CYMBALTA) 60 MG capsule Take 1 capsule (60 mg total) by mouth daily. 04/23/20   Lomax, Amy, NP  gabapentin (NEURONTIN) 300 MG capsule Take 1 capsule (300 mg total) by mouth 3 (three) times daily. 04/23/20   Lomax, Amy, NP  levonorgestrel (MIRENA) 20 MCG/24HR IUD 1 each by Intrauterine route once.    [provider]  methocarbamol (ROBAXIN) 500 MG tablet Take 2 tablets (1,000 mg total) by mouth up to 3 times a day 11/07/20     solifenacin (VESICARE) 10 MG tablet Take 1 tablet  (10 mg total) by mouth daily. 04/23/20   Lomax, Amy, NP  VITAMIN D PO Take 1 capsule by mouth daily.     [provider]  VUMERITY 231 MG CPDR TAKE 2 CAPSULES (462MG ) BY MOUTH TWICE A DAY 09/23/20   Sater, 09/25/20, MD      Allergies    Patient has no known allergies.    Review of Systems   Review of Systems  Constitutional:  Negative for fever.  Musculoskeletal:  Positive for arthralgias and joint swelling. Negative for myalgias.  Neurological:  Negative for weakness and numbness.  All other systems reviewed and are negative.  Physical Exam Updated Vital Signs BP 125/75    Pulse 80    Temp 98.6 F (37 C) (Oral)    Resp 16    Ht 5\' 8"  (1.727 m)    Wt 107.5 kg    SpO2 100%    BMI 36.04 kg/m  Physical Exam Constitutional:      Appearance: She is well-developed.  HENT:     Head: Atraumatic.  Cardiovascular:     Comments: Pulses equal bilaterally Musculoskeletal:        General: Swelling and tenderness present.     Right hand: Swelling and bony tenderness present. Normal sensation. Normal capillary refill. Normal pulse.     Cervical back: Normal range  of motion.     Comments: Ttp at the right thumb mcp joint, mild edema noted. No crepitus with ROM, no obvious joint laxity. Significant pain with active and passive ROM at this joint. Distal sensation intact with less than 2 sec cap refill.   Skin:    General: Skin is warm and dry.  Neurological:     Mental Status: She is alert.     Sensory: No sensory deficit.     Motor: No weakness.     Deep Tendon Reflexes: Reflexes normal.    ED Results / Procedures / Treatments   Labs (all labs ordered are listed, but only abnormal results are displayed) Labs Reviewed - No data to display  EKG None  Radiology DG Hand Complete Right  Result Date: 03/26/2021 CLINICAL DATA:  Thumb pain after lifting object at work EXAM: RIGHT HAND - COMPLETE 3+ VIEW COMPARISON:  None. FINDINGS: There is no evidence of fracture or dislocation.  There is no evidence of arthropathy or other focal bone abnormality. Soft tissues are unremarkable. IMPRESSION: Negative. Electronically Signed   By: Maudry Mayhew M.D.   On: 03/26/2021 12:16    Procedures Procedures    Medications Ordered in ED Medications - No data to display  ED Course/ Medical Decision Making/ A&P                           Medical Decision Making Amount and/or Complexity of Data Reviewed Radiology: ordered and independent interpretation performed.    Details: Imaging reviewed, no fractures.  Risk Prescription drug management. Risk Details: Patient with a suspected right thumb sprain, however cannot rule out a full or partial ligament tear, although the joint does appear to be stable, she does not tolerate stressing the joint due to significant pain.  She was placed in a thumb spica splint, discussed home treatment including ice and elevation is much as is possible.  Increased ibuprofen dosing to 3 times daily.  Patient was referred to orthopedics, however this is also a work-related injury, she may also benefit from follow-up with occupational medicine.  She was given a referral contact for [facility] employee health additionally.           Final Clinical Impression(s) / ED Diagnoses Final diagnoses:  Sprain of metacarpophalangeal (MCP) joint of right thumb, initial encounter    Rx / DC Orders ED Discharge Orders          Ordered    ibuprofen (ADVIL) 600 MG tablet  3 times daily        03/26/21 1306              Victoriano Lain 03/26/21 Daryl Eastern, MD 04/05/21 [phone]

## 2021-05-14 ENCOUNTER — Other Ambulatory Visit: Payer: Self-pay

## 2021-05-14 ENCOUNTER — Ambulatory Visit
Admission: EM | Admit: 2021-05-14 | Discharge: 2021-05-14 | Disposition: A | Discharge status: Home / Self Care | Payer: Self-pay | Attending: Family Medicine | Admitting: Family Medicine

## 2021-05-14 DIAGNOSIS — J309 Allergic rhinitis, unspecified: Secondary | ICD-10-CM

## 2021-05-14 DIAGNOSIS — R112 Nausea with vomiting, unspecified: Secondary | ICD-10-CM

## 2021-05-14 MED ORDER — ONDANSETRON 4 MG PO TBDP: 4.0000 mg | ORAL_TABLET | Freq: Three times a day (TID) | ORAL | 0 refills | Status: DC | PRN

## 2021-05-14 MED ORDER — FLUTICASONE PROPIONATE 50 MCG/ACT NA SUSP: 1.0000 | Freq: Two times a day (BID) | NASAL | 2 refills | Status: DC

## 2021-05-14 MED ORDER — ONDANSETRON 4 MG PO TBDP
4.0000 mg | ORAL_TABLET | Freq: Once | ORAL | Status: AC
  Administered 2021-05-14: 4 mg via ORAL

## 2021-05-14 MED ORDER — DEXAMETHASONE SODIUM PHOSPHATE 10 MG/ML IJ SOLN
10.0000 mg | Freq: Once | INTRAMUSCULAR | Status: AC
  Administered 2021-05-14: 10 mg via INTRAMUSCULAR

## 2021-05-14 MED ORDER — CETIRIZINE HCL 10 MG PO TABS: 10.0000 mg | ORAL_TABLET | Freq: Every day | ORAL | 2 refills | Status: DC

## 2021-05-14 NOTE — ED Provider Notes (Signed)
?[facility] ? ? ? ?CSN: [id number] ?Arrival date & time: 05/14/21  1638 ? ? ?  ? ?History   ?Chief Complaint ?Chief Complaint  ?Patient presents with  ? Emesis  ? ? ?HPI ?Jessica [name] is a 36 y.o. female.  ? ?Presenting today with 2-day history of nausea, vomiting that is happening frequently throughout the day, including waking her out of her sleep.  She has been trying to take Pepto-Bismol but she is throwing it right back up.  Not tolerating anything by mouth at this time.  Denies fever, chills, constipation, diarrhea, abdominal pain, sore throat, recent sick contacts.  She is also having an allergy flareup with about a week of progressively worsening congestion, eyes watering, nose running, sinus pressure.  Has been trying Zyrtec and DayQuil with no relief. ? ? ?Past Medical History:  ?Diagnosis Date  ? Chronic hip pain   ? Closed left arm fracture   ? age [age]  ? MS (multiple sclerosis) (HCC)   ? Palpitations   ? Scoliosis   ? ? ?Patient Active Problem List  ? Diagnosis Date Noted  ? OSA (obstructive sleep apnea) 10/25/2019  ? High risk medication use 10/25/2019  ? Nonintractable episodic headache 10/25/2019  ? BV (bacterial vaginosis) 12/23/2017  ? Encounter for IUD insertion 12/23/2017  ? Depression 09/08/2017  ? Encounter for smoking cessation counseling 09/08/2017  ? Multiple sclerosis (HCC) 05/04/2017  ? Facial numbness 05/04/2017  ? Transverse myelitis (HCC) 05/04/2017  ? Ataxia 05/04/2017  ? Urinary urgency 05/04/2017  ? Neurosensory deficit 05/09/2013  ? Leg weakness, bilateral 05/09/2013  ? OBESITY, MORBID 10/22/2009  ? ULNAR NEUROPATHY 10/22/2009  ? CHOLELITHIASIS 10/22/2009  ? ? ?Past Surgical History:  ?Procedure Laterality Date  ? adnoids    ? CHOLECYSTECTOMY    ? NASAL SEPTUM SURGERY    ? toe nails removed    ? TONSILLECTOMY    ? ? ?OB History   ? ? Gravida  ?2  ? Para  ?1  ? Term  ?1  ? Preterm  ?   ? AB  ?1  ? Living  ?1  ?  ? ? SAB  ?1  ? IAB  ?   ? Ectopic  ?   ? Multiple  ?    ? Live Births  ?1  ?   ?  ?  ? ? ? ?Home Medications   ? ?Prior to Admission medications   ?Medication Sig Start Date End Date Taking? Authorizing Provider  ?cetirizine (ZYRTEC ALLERGY) 10 MG tablet Take 1 tablet (10 mg total) by mouth daily. 05/14/21  Yes Particia Nearing, PA-C  ?fluticasone (FLONASE) 50 MCG/ACT nasal spray Place 1 spray into both nostrils 2 (two) times daily. 05/14/21  Yes Particia Nearing, PA-C  ?ondansetron (ZOFRAN-ODT) 4 MG disintegrating tablet Take 1 tablet (4 mg total) by mouth every 8 (eight) hours as needed for nausea or vomiting. 05/14/21  Yes Particia Nearing, PA-C  ?buPROPion (WELLBUTRIN XL) 300 MG 24 hr tablet Take 1 tablet (300 mg total) by mouth daily. 04/23/20   Lomax, Amy, NP  ?cetirizine (ZYRTEC) 10 MG tablet Take 10 mg by mouth daily.    [provider]  ?cyclobenzaprine (FLEXERIL) 10 MG tablet Take 1 tablet (10 mg total) by mouth 2 (two) times daily as needed for muscle spasms. ?Patient not taking: Reported on 10/01/2020 07/17/20   Moshe Cipro, NP  ?DULoxetine (CYMBALTA) 60 MG capsule Take 1 capsule (60 mg total) by mouth daily.  04/23/20   Lomax, Amy, NP  ?gabapentin (NEURONTIN) 300 MG capsule Take 1 capsule (300 mg total) by mouth 3 (three) times daily. 04/23/20   Lomax, Amy, NP  ?ibuprofen (ADVIL) 600 MG tablet Take 1 tablet (600 mg total) by mouth 3 (three) times daily. 03/26/21   Burgess Amor, PA-C  ?levonorgestrel (MIRENA) 20 MCG/24HR IUD 1 each by Intrauterine route once.    [provider]  ?methocarbamol (ROBAXIN) 500 MG tablet Take 2 tablets (1,000 mg total) by mouth up to 3 times a day 11/07/20     ?solifenacin (VESICARE) 10 MG tablet Take 1 tablet (10 mg total) by mouth daily. 04/23/20   Lomax, Amy, NP  ?VITAMIN D PO Take 1 capsule by mouth daily.     [provider]  ?VUMERITY 231 MG CPDR TAKE 2 CAPSULES (462MG ) BY MOUTH TWICE A DAY 09/23/20   Sater, Pearletha Furl, MD  ? ? ?Family History ?Family History  ?Problem Relation Age of  Onset  ? Hypertension Mother   ? Diabetes Mother   ?     prediabetic  ? COPD Mother   ? Diabetes Paternal Grandfather   ? Hypertension Maternal Grandmother   ? Other Maternal Grandmother   ? Addison's disease Maternal Grandmother   ? Cancer Maternal Grandfather   ? ? ?Social History ?Social History  ? ?Tobacco Use  ? Smoking status: Every Day  ?  Packs/day: 1.00  ?  Years: 13.00  ?  Pack years: 13.00  ?  Types: Cigarettes  ? Smokeless tobacco: Never  ?Vaping Use  ? Vaping Use: Never used  ?Substance Use Topics  ? Alcohol use: Yes  ? Drug use: Yes  ?  Types: Marijuana  ?  Comment: occ  ? ? ? ?Allergies   ?Patient has no known allergies. ? ? ?Review of Systems ?Review of Systems ?Per HPI ? ?Physical Exam ?Triage Vital Signs ?ED Triage Vitals  ?Enc Vitals Group  ?   BP 05/14/21 1647 136/79  ?   Pulse Rate 05/14/21 1647 98  ?   Resp 05/14/21 1647 18  ?   Temp 05/14/21 1647 98.1 ?F (36.7 ?C)  ?   Temp Source 05/14/21 1647 Oral  ?   SpO2 05/14/21 1647 97 %  ?   Weight --   ?   Height --   ?   Head Circumference --   ?   Peak Flow --   ?   Pain Score 05/14/21 1649 0  ?   Pain Loc --   ?   Pain Edu? --   ?   Excl. in GC? --   ? ?No data found. ? ?Updated Vital Signs ?BP 136/79 (BP Location: Right Arm)   Pulse 98   Temp 98.1 ?F (36.7 ?C) (Oral)   Resp 18   SpO2 97%  ? ?Visual Acuity ?Right Eye Distance:   ?Left Eye Distance:   ?Bilateral Distance:   ? ?Right Eye Near:   ?Left Eye Near:    ?Bilateral Near:    ? ?Physical Exam ?Vitals and nursing note reviewed.  ?Constitutional:   ?   Appearance: Normal appearance. She is not ill-appearing.  ?HENT:  ?   Head: Atraumatic.  ?   Right Ear: Tympanic membrane normal.  ?   Left Ear: Tympanic membrane normal.  ?   Nose: Rhinorrhea present.  ?   Mouth/Throat:  ?   Mouth: Mucous membranes are moist.  ?   Pharynx: Oropharynx is clear. No posterior  oropharyngeal erythema.  ?Eyes:  ?   Extraocular Movements: Extraocular movements intact.  ?   Conjunctiva/sclera: Conjunctivae normal.   ?Cardiovascular:  ?   Rate and Rhythm: Normal rate and regular rhythm.  ?   Heart sounds: Normal heart sounds.  ?Pulmonary:  ?   Effort: Pulmonary effort is normal.  ?   Breath sounds: Normal breath sounds. No wheezing or rales.  ?Abdominal:  ?   General: Bowel sounds are normal. There is no distension.  ?   Palpations: Abdomen is soft.  ?   Tenderness: There is no abdominal tenderness. There is no right CVA tenderness, left CVA tenderness or guarding.  ?Musculoskeletal:     ?   General: Normal range of motion.  ?   Cervical back: Normal range of motion and neck supple.  ?Skin: ?   General: Skin is warm and dry.  ?Neurological:  ?   Mental Status: She is alert and oriented to person, place, and time.  ?   Motor: No weakness.  ?   Gait: Gait normal.  ?Psychiatric:     ?   Mood and Affect: Mood normal.     ?   Thought Content: Thought content normal.     ?   Judgment: Judgment normal.  ? ?UC Treatments / Results  ?Labs ?(all labs ordered are listed, but only abnormal results are displayed) ?Labs Reviewed - No data to display ? ?EKG ? ? ?Radiology ?No results found. ? ?Procedures ?Procedures (including critical care time) ? ?Medications Ordered in UC ?Medications  ?dexamethasone (DECADRON) injection 10 mg (10 mg Intramuscular Given 05/14/21 1721)  ?ondansetron (ZOFRAN-ODT) disintegrating tablet 4 mg (4 mg Oral Given 05/14/21 1721)  ? ?Initial Impression / Assessment and Plan / UC Course  ?I have reviewed the triage vital signs and the nursing notes. ? ?Pertinent labs & imaging results that were available during my care of the patient were reviewed by me and considered in my medical decision making (see chart for details). ? ?  ? ?Suspect viral GI illness causing symptoms, Zofran given in clinic for active nausea and prescription given for as needed use additionally.  We will also treat allergy exacerbation with IM Decadron and restart Zyrtec, Flonase regimen regularly.  Supportive care and return precautions reviewed.   Work note given. ? ?Final Clinical Impressions(s) / UC Diagnoses  ? ?Final diagnoses:  ?Nausea and vomiting, unspecified vomiting type  ?Allergic sinusitis  ? ?Discharge Instructions   ?None ?  ? ?ED Prescr

## 2021-05-14 NOTE — ED Triage Notes (Signed)
Pt states she has been vomiting for 2 days ? ?Pt states the vomit is waking her up out of her sleep and she is staying nauseas ? ?Pt states her allergies have been bothering her for over a week, eyes are watering, nose is running and burning ? ?Pt states she has tried Zyrtec and Dayquil ? ?Denies Fever ?

## 2021-06-12 ENCOUNTER — Other Ambulatory Visit: Payer: Self-pay

## 2021-06-12 ENCOUNTER — Ambulatory Visit
Admission: EM | Admit: 2021-06-12 | Discharge: 2021-06-12 | Disposition: A | Discharge status: Home / Self Care | Payer: Self-pay | Attending: Family Medicine | Admitting: Family Medicine

## 2021-06-12 ENCOUNTER — Encounter: Payer: Self-pay | Admitting: Emergency Medicine

## 2021-06-12 DIAGNOSIS — R112 Nausea with vomiting, unspecified: Secondary | ICD-10-CM

## 2021-06-12 DIAGNOSIS — R197 Diarrhea, unspecified: Secondary | ICD-10-CM

## 2021-06-12 MED ORDER — ONDANSETRON 4 MG PO TBDP: 4.0000 mg | ORAL_TABLET | Freq: Once | ORAL | Status: DC

## 2021-06-12 MED ORDER — ONDANSETRON 4 MG PO TBDP
4.0000 mg | ORAL_TABLET | Freq: Once | ORAL | Status: AC
  Administered 2021-06-12: 4 mg via ORAL

## 2021-06-12 MED ORDER — ONDANSETRON HCL 4 MG PO TABS: 4.0000 mg | ORAL_TABLET | Freq: Four times a day (QID) | ORAL | 0 refills | Status: DC

## 2021-06-12 NOTE — ED Triage Notes (Signed)
Pt reports daughter had similar symptoms this weekend and reports she is now better but pt reports emesis and diarrhea, body aches since Saturday.   ?

## 2021-06-12 NOTE — ED Provider Notes (Signed)
?[facility] ? ? ? ?CSN: [id number] ?Arrival date & time: 06/12/21  0813 ? ? ?  ? ?History   ?Chief Complaint ?Chief Complaint  ?Patient presents with  ? Emesis  ? ? ?HPI ?Jessica [name] is a 36 y.o. female.  ? ?Presenting today with 5-day history of waxing and waning nausea, vomiting, diarrhea, body aches.  She states she tried to go back to work but continued to have active symptoms so had to come home.  Denies fever, chills, severe abdominal pain, melena, upper respiratory symptoms.  Not trying anything over-the-counter for symptoms.  States she has been tolerating applesauce but nothing else by mouth.  No known history of chronic GI issues.  No new medications, recent travel, diet changes.  States her daughter just got over a stomach virus which she thinks that she caught from her. ? ? ?Past Medical History:  ?Diagnosis Date  ? Chronic hip pain   ? Closed left arm fracture   ? age [age]  ? MS (multiple sclerosis) (HCC)   ? Palpitations   ? Scoliosis   ? ? ?Patient Active Problem List  ? Diagnosis Date Noted  ? OSA (obstructive sleep apnea) 10/25/2019  ? High risk medication use 10/25/2019  ? Nonintractable episodic headache 10/25/2019  ? BV (bacterial vaginosis) 12/23/2017  ? Encounter for IUD insertion 12/23/2017  ? Depression 09/08/2017  ? Encounter for smoking cessation counseling 09/08/2017  ? Multiple sclerosis (HCC) 05/04/2017  ? Facial numbness 05/04/2017  ? Transverse myelitis (HCC) 05/04/2017  ? Ataxia 05/04/2017  ? Urinary urgency 05/04/2017  ? Neurosensory deficit 05/09/2013  ? Leg weakness, bilateral 05/09/2013  ? OBESITY, MORBID 10/22/2009  ? ULNAR NEUROPATHY 10/22/2009  ? CHOLELITHIASIS 10/22/2009  ? ? ?Past Surgical History:  ?Procedure Laterality Date  ? adnoids    ? CHOLECYSTECTOMY    ? NASAL SEPTUM SURGERY    ? toe nails removed    ? TONSILLECTOMY    ? ? ?OB History   ? ? Gravida  ?2  ? Para  ?1  ? Term  ?1  ? Preterm  ?   ? AB  ?1  ? Living  ?1  ?  ? ? SAB  ?1  ? IAB  ?   ?  Ectopic  ?   ? Multiple  ?   ? Live Births  ?1  ?   ?  ?  ? ? ? ?Home Medications   ? ?Prior to Admission medications   ?Medication Sig Start Date End Date Taking? Authorizing Provider  ?ondansetron (ZOFRAN) 4 MG tablet Take 1 tablet (4 mg total) by mouth every 6 (six) hours. 06/12/21  Yes Particia Nearing, PA-C  ?buPROPion (WELLBUTRIN XL) 300 MG 24 hr tablet Take 1 tablet (300 mg total) by mouth daily. 04/23/20   Lomax, Amy, NP  ?cetirizine (ZYRTEC ALLERGY) 10 MG tablet Take 1 tablet (10 mg total) by mouth daily. 05/14/21   Particia Nearing, PA-C  ?cetirizine (ZYRTEC) 10 MG tablet Take 10 mg by mouth daily.    [provider]  ?cyclobenzaprine (FLEXERIL) 10 MG tablet Take 1 tablet (10 mg total) by mouth 2 (two) times daily as needed for muscle spasms. ?Patient not taking: Reported on 10/01/2020 07/17/20   Moshe Cipro, NP  ?DULoxetine (CYMBALTA) 60 MG capsule Take 1 capsule (60 mg total) by mouth daily. 04/23/20   Lomax, Amy, NP  ?fluticasone (FLONASE) 50 MCG/ACT nasal spray Place 1 spray into both nostrils 2 (two) times daily. 05/14/21  Particia Nearing, PA-C  ?gabapentin (NEURONTIN) 300 MG capsule Take 1 capsule (300 mg total) by mouth 3 (three) times daily. 04/23/20   Lomax, Amy, NP  ?ibuprofen (ADVIL) 600 MG tablet Take 1 tablet (600 mg total) by mouth 3 (three) times daily. 03/26/21   Burgess Amor, PA-C  ?levonorgestrel (MIRENA) 20 MCG/24HR IUD 1 each by Intrauterine route once.    [provider]  ?methocarbamol (ROBAXIN) 500 MG tablet Take 2 tablets (1,000 mg total) by mouth up to 3 times a day 11/07/20     ?ondansetron (ZOFRAN-ODT) 4 MG disintegrating tablet Take 1 tablet (4 mg total) by mouth every 8 (eight) hours as needed for nausea or vomiting. 05/14/21   Particia Nearing, PA-C  ?solifenacin (VESICARE) 10 MG tablet Take 1 tablet (10 mg total) by mouth daily. 04/23/20   Lomax, Amy, NP  ?VITAMIN D PO Take 1 capsule by mouth daily.     [provider]  ?VUMERITY  231 MG CPDR TAKE 2 CAPSULES (462MG ) BY MOUTH TWICE A DAY 09/23/20   Sater, 09/25/20, MD  ? ? ?Family History ?Family History  ?Problem Relation Age of Onset  ? Hypertension Mother   ? Diabetes Mother   ?     prediabetic  ? COPD Mother   ? Diabetes Paternal Grandfather   ? Hypertension Maternal Grandmother   ? Other Maternal Grandmother   ? Addison's disease Maternal Grandmother   ? Cancer Maternal Grandfather   ? ? ?Social History ?Social History  ? ?Tobacco Use  ? Smoking status: Every Day  ?  Packs/day: 1.00  ?  Years: 13.00  ?  Pack years: 13.00  ?  Types: Cigarettes  ? Smokeless tobacco: Never  ?Vaping Use  ? Vaping Use: Never used  ?Substance Use Topics  ? Alcohol use: Yes  ? Drug use: Yes  ?  Types: Marijuana  ?  Comment: occ  ? ? ? ?Allergies   ?Patient has no known allergies. ? ? ?Review of Systems ?Review of Systems ?Per HPI ? ?Physical Exam ?Triage Vital Signs ?ED Triage Vitals  ?Enc Vitals Group  ?   BP 06/12/21 0838 123/85  ?   Pulse Rate 06/12/21 0838 79  ?   Resp 06/12/21 0838 16  ?   Temp 06/12/21 0838 98.2 ?F (36.8 ?C)  ?   Temp Source 06/12/21 0838 Oral  ?   SpO2 06/12/21 0838 98 %  ?   Weight 06/12/21 0852 217 lb 11.2 oz (98.7 kg)  ?   Height 06/12/21 0849 5\' 8"  (1.727 m)  ?   Head Circumference --   ?   Peak Flow --   ?   Pain Score 06/12/21 0849 4  ?   Pain Loc --   ?   Pain Edu? --   ?   Excl. in GC? --   ? ?No data found. ? ?Updated Vital Signs ?BP 123/85 (BP Location: Right Arm)   Pulse 79   Temp 98.2 ?F (36.8 ?C) (Oral)   Resp 16   Ht 5\' 8"  (1.727 m)   Wt 217 lb 11.2 oz (98.7 kg)   SpO2 98%   BMI 33.10 kg/m?  ? ?Visual Acuity ?Right Eye Distance:   ?Left Eye Distance:   ?Bilateral Distance:   ? ?Right Eye Near:   ?Left Eye Near:    ?Bilateral Near:    ? ?Physical Exam ?Vitals and nursing note reviewed.  ?Constitutional:   ?   Appearance: Normal appearance.  She is not ill-appearing.  ?HENT:  ?   Head: Atraumatic.  ?   Mouth/Throat:  ?   Mouth: Mucous membranes are moist.  ?Eyes:  ?    Extraocular Movements: Extraocular movements intact.  ?   Conjunctiva/sclera: Conjunctivae normal.  ?Cardiovascular:  ?   Rate and Rhythm: Normal rate and regular rhythm.  ?   Heart sounds: Normal heart sounds.  ?Pulmonary:  ?   Effort: Pulmonary effort is normal.  ?   Breath sounds: Normal breath sounds.  ?Abdominal:  ?   General: Bowel sounds are normal. There is no distension.  ?   Palpations: Abdomen is soft.  ?   Tenderness: There is abdominal tenderness. There is no right CVA tenderness, left CVA tenderness or guarding.  ?   Comments: Mild diffuse tenderness to palpation across abdomen without distention or guarding  ?Musculoskeletal:     ?   General: Normal range of motion.  ?   Cervical back: Normal range of motion and neck supple.  ?Skin: ?   General: Skin is warm and dry.  ?Neurological:  ?   Mental Status: She is alert and oriented to person, place, and time.  ?   Motor: No weakness.  ?   Gait: Gait normal.  ?Psychiatric:     ?   Mood and Affect: Mood normal.     ?   Thought Content: Thought content normal.     ?   Judgment: Judgment normal.  ? ? ? ?UC Treatments / Results  ?Labs ?(all labs ordered are listed, but only abnormal results are displayed) ?Labs Reviewed - No data to display ? ?EKG ? ? ?Radiology ?No results found. ? ?Procedures ?Procedures (including critical care time) ? ?Medications Ordered in UC ?Medications  ?ondansetron (ZOFRAN-ODT) disintegrating tablet 4 mg (4 mg Oral Given 06/12/21 0905)  ? ? ?Initial Impression / Assessment and Plan / UC Course  ?I have reviewed the triage vital signs and the nursing notes. ? ?Pertinent labs & imaging results that were available during my care of the patient were reviewed by me and considered in my medical decision making (see chart for details). ? ?  ? ?Vitals and exam overall very reassuring with no red flag findings.  Suspect viral GI illness.  Oral Zofran given in clinic today for active nausea, Zofran sent to the pharmacy for as needed use.  Brat  diet, fluids, return precautions given.  Work note given. ? ?Final Clinical Impressions(s) / UC Diagnoses  ? ?Final diagnoses:  ?Nausea vomiting and diarrhea  ? ?Discharge Instructions   ?None ?  ? ?ED Prescription

## 2022-01-20 ENCOUNTER — Telehealth: Payer: Self-pay | Admitting: Neurology

## 2022-01-20 NOTE — Telephone Encounter (Signed)
Pt is calling. Stated she needs clearance from Dr. 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 Stated she needs a copy of Sleep Study. Pt is requesting these items be fax to [phone] ([facility]).

## 2022-01-20 NOTE — Telephone Encounter (Signed)
Called pt back. Informed her she needed to make an appointment to see Dr, Epimenio Foot. Pt asked to talk to billing because she doesn't have insurance. I transferred her to Angie in billing.

## 2022-01-20 NOTE — Telephone Encounter (Signed)
Jessica [name], patient has not been seen since 10/01/2020. This is over a yr since seen. She will need appt first. Please call pt back to schedule appt

## 2022-03-19 ENCOUNTER — Ambulatory Visit: Payer: Self-pay | Admitting: Neurology

## 2022-04-17 ENCOUNTER — Ambulatory Visit
Admission: EM | Admit: 2022-04-17 | Discharge: 2022-04-17 | Disposition: A | Discharge status: Home / Self Care | Payer: Self-pay | Attending: Nurse Practitioner | Admitting: Nurse Practitioner

## 2022-04-17 ENCOUNTER — Encounter: Payer: Self-pay | Admitting: Emergency Medicine

## 2022-04-17 ENCOUNTER — Other Ambulatory Visit: Payer: Self-pay

## 2022-04-17 DIAGNOSIS — H5789 Other specified disorders of eye and adnexa: Secondary | ICD-10-CM

## 2022-04-17 DIAGNOSIS — H66001 Acute suppurative otitis media without spontaneous rupture of ear drum, right ear: Secondary | ICD-10-CM

## 2022-04-17 MED ORDER — AMOXICILLIN 500 MG PO CAPS: 500.0000 mg | ORAL_CAPSULE | Freq: Three times a day (TID) | ORAL | 0 refills | Status: AC
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
## 2022-04-17 NOTE — Discharge Instructions (Addendum)
You have a ear infection. You have prescribed Amoxicillin 500 mg three times a day for 7 days. There were not eye drops on the [facility] list. The Amoxicillin will cover the eye infection.  Take as directed Follow up if symptoms persist.

## 2022-04-17 NOTE — ED Provider Notes (Addendum)
[facility]    CSN: [id number] Arrival date & time: 04/17/22  1149      History   Chief Complaint Chief Complaint  Patient presents with   Sore Throat    HPI Jessica [name] is a 37 y.o. female.   HPI  She is in today complaining of a sore throat with right ear pain. She reports that she was licked by her dog which was covered with stool in the face,; eyes. She was woke up this morning with her eyes with drainage. Denies fever, chills, headache, dizziness, visual changes, shortness of breath, dyspnea on exertion, chest pain, nausea, vomiting. Past Medical History:  Diagnosis Date   Chronic hip pain    Closed left arm fracture    age [age]   MS (multiple sclerosis) [facility])    Palpitations    Scoliosis     Patient Active Problem List   Diagnosis Date Noted   OSA (obstructive sleep apnea) 10/25/2019   High risk medication use 10/25/2019   Nonintractable episodic headache 10/25/2019   BV (bacterial vaginosis) 12/23/2017   Encounter for IUD insertion 12/23/2017   Depression 09/08/2017   Encounter for smoking cessation counseling 09/08/2017   Multiple sclerosis ([place]) 05/04/2017   Facial numbness 05/04/2017   Transverse myelitis ([place]) 05/04/2017   Ataxia 05/04/2017   Urinary urgency 05/04/2017   Neurosensory deficit 05/09/2013   Leg weakness, bilateral 05/09/2013   OBESITY, MORBID 10/22/2009   ULNAR NEUROPATHY 10/22/2009   CHOLELITHIASIS 10/22/2009    Past Surgical History:  Procedure Laterality Date   adnoids     CHOLECYSTECTOMY     NASAL SEPTUM SURGERY     toe nails removed     TONSILLECTOMY      OB History     Gravida  2   Para  1   Term  1   Preterm      AB  1   Living  1      SAB  1   IAB      Ectopic      Multiple      Live Births  1            Home Medications    Prior to Admission medications   Medication Sig Start Date End Date Taking? Authorizing Provider  amoxicillin (AMOXIL) 500 MG capsule Take 1  capsule (500 mg total) by mouth 3 (three) times daily for 7 days. 04/17/22 04/24/22 Yes Vevelyn Francois, NP  buPROPion (WELLBUTRIN XL) 300 MG 24 hr tablet Take 1 tablet (300 mg total) by mouth daily. Patient not taking: Reported on 04/17/2022 04/23/20   Debbora Presto, NP  cyclobenzaprine (FLEXERIL) 10 MG tablet Take 1 tablet (10 mg total) by mouth 2 (two) times daily as needed for muscle spasms. Patient not taking: Reported on 10/01/2020 07/17/20   Faustino Congress, NP  DULoxetine (CYMBALTA) 60 MG capsule Take 1 capsule (60 mg total) by mouth daily. Patient not taking: Reported on 04/17/2022 04/23/20   Debbora Presto, NP  gabapentin (NEURONTIN) 300 MG capsule Take 1 capsule (300 mg total) by mouth 3 (three) times daily. Patient not taking: Reported on 04/17/2022 04/23/20   Debbora Presto, NP  ibuprofen (ADVIL) 600 MG tablet Take 1 tablet (600 mg total) by mouth 3 (three) times daily. 03/26/21   Evalee Jefferson, PA-C  levonorgestrel (MIRENA) 20 MCG/24HR IUD 1 each by Intrauterine route once.    [provider]  methocarbamol (ROBAXIN) 500 MG tablet Take 2 tablets (1,000 mg  total) by mouth up to 3 times a day Patient not taking: Reported on 04/17/2022 11/07/20     solifenacin (VESICARE) 10 MG tablet Take 1 tablet (10 mg total) by mouth daily. Patient not taking: Reported on 04/17/2022 04/23/20   Debbora Presto, NP  VITAMIN D PO Take 1 capsule by mouth daily.  Patient not taking: Reported on 04/17/2022    [provider]  VUMERITY 231 MG CPDR TAKE 2 CAPSULES (462MG) BY MOUTH TWICE A DAY Patient not taking: Reported on 04/17/2022 09/23/20   Britt Bottom, MD    Family History Family History  Problem Relation Age of Onset   Hypertension Mother    Diabetes Mother        prediabetic   COPD Mother    Diabetes Paternal Grandfather    Hypertension Maternal Grandmother    Other Maternal Grandmother    Addison's disease Maternal Grandmother    Cancer Maternal Grandfather     Social History Social History    Tobacco Use   Smoking status: Every Day    Packs/day: 1.00    Years: 13.00    Total pack years: 13.00    Types: Cigarettes   Smokeless tobacco: Never  Vaping Use   Vaping Use: Never used  Substance Use Topics   Alcohol use: Yes   Drug use: Yes    Types: Marijuana    Comment: occ     Allergies   Aleve [naproxen]   Review of Systems Review of Systems   Physical Exam Triage Vital Signs ED Triage Vitals  Enc Vitals Group     BP 04/17/22 1214 110/78     Pulse Rate 04/17/22 1214 82     Resp 04/17/22 1214 20     Temp 04/17/22 1214 98.7 F (37.1 C)     Temp Source 04/17/22 1214 Oral     SpO2 04/17/22 1214 99 %     Weight --      Height --      Head Circumference --      Peak Flow --      Pain Score 04/17/22 1207 5     Pain Loc --      Pain Edu? --      Excl. in [place]? --    No data found.  Updated Vital Signs BP 110/78 (BP Location: Right Arm)   Pulse 82   Temp 98.7 F (37.1 C) (Oral)   Resp 20   SpO2 99%   Visual Acuity Right Eye Distance:   Left Eye Distance:   Bilateral Distance:    Right Eye Near:   Left Eye Near:    Bilateral Near:     Physical Exam Constitutional:      Appearance: She is obese.  HENT:     Head: Normocephalic and atraumatic.     Right Ear: Tympanic membrane normal.     Left Ear: Tympanic membrane normal.     Mouth/Throat:     Tonsils: No tonsillar exudate.  Eyes:     Conjunctiva/sclera: Conjunctivae normal.     Pupils: Pupils are equal, round, and reactive to light.  Cardiovascular:     Rate and Rhythm: Normal rate and regular rhythm.     Heart sounds: Normal heart sounds.  Pulmonary:     Effort: Pulmonary effort is normal.  Musculoskeletal:     Cervical back: Normal range of motion.  Skin:    General: Skin is warm and dry.     Capillary Refill: Capillary refill  takes less than 2 seconds.  Neurological:     General: No focal deficit present.     Mental Status: She is alert and oriented to person, place, and time.      UC Treatments / Results  Labs (all labs ordered are listed, but only abnormal results are displayed) Labs Reviewed - No data to display  EKG   Radiology No results found.  Procedures Procedures (including critical care time)  Medications Ordered in UC Medications - No data to display  Initial Impression / Assessment and Plan / UC Course  I have reviewed the triage vital signs and the nursing notes.  Pertinent labs & imaging results that were available during my care of the patient were reviewed by me and considered in my medical decision making (see chart for details).   Sore throat Final Clinical Impressions(s) / UC Diagnoses   Final diagnoses:  Non-recurrent acute suppurative otitis media of right ear without spontaneous rupture of tympanic membrane  Requested medication of the [facility] $4.00 list no eye drops available.    Discharge Instructions      You have a ear infection. You have prescribed Amoxicillin 500 mg three times a day for 7 days. There were not eye drops on the [facility] list. The Amoxicillin will cover the eye infection.  Take as directed Follow up if symptoms persist.      ED Prescriptions     Medication Sig Dispense Auth. Provider   amoxicillin (AMOXIL) 500 MG capsule Take 1 capsule (500 mg total) by mouth 3 (three) times daily for 7 days. 21 capsule Vevelyn Francois, NP      PDMP not reviewed this encounter.   Vevelyn Francois, NP 04/17/22 1644    Vevelyn Francois, NP 04/17/22 [phone]

## 2022-04-30 ENCOUNTER — Encounter: Payer: Self-pay | Admitting: Radiology

## 2022-06-22 ENCOUNTER — Ambulatory Visit: Payer: Self-pay | Admitting: Neurology

## 2022-07-14 ENCOUNTER — Other Ambulatory Visit: Payer: Self-pay

## 2022-07-14 ENCOUNTER — Ambulatory Visit
Admission: EM | Admit: 2022-07-14 | Discharge: 2022-07-14 | Disposition: A | Discharge status: Home / Self Care | Payer: Self-pay | Attending: Nurse Practitioner | Admitting: Nurse Practitioner

## 2022-07-14 ENCOUNTER — Encounter: Payer: Self-pay | Admitting: Emergency Medicine

## 2022-07-14 DIAGNOSIS — J069 Acute upper respiratory infection, unspecified: Secondary | ICD-10-CM

## 2022-07-14 DIAGNOSIS — H66002 Acute suppurative otitis media without spontaneous rupture of ear drum, left ear: Secondary | ICD-10-CM

## 2022-07-14 MED ORDER — BENZONATATE 100 MG PO CAPS: 100.0000 mg | ORAL_CAPSULE | Freq: Three times a day (TID) | ORAL | 0 refills | Status: DC

## 2022-07-14 MED ORDER — AMOXICILLIN-POT CLAVULANATE 875-125 MG PO TABS: 1.0000 | ORAL_TABLET | Freq: Two times a day (BID) | ORAL | 0 refills | Status: AC

## 2022-07-14 NOTE — ED Triage Notes (Addendum)
Pt reports bilateral ear pressure, nasal congestion, intermittent productive cough, fever/chills x1 week. No change in symptoms with otc medication.

## 2022-07-14 NOTE — Discharge Instructions (Addendum)
You have an ear infection in your left ear.  Please take the Augmentin as prescribed to treat it.  Symptoms should improve over the next week to 10 days.   Some things that can make you feel better are: - Increased rest - Increasing fluid with water/sugar free electrolytes - Acetaminophen and ibuprofen as needed for fever/pain - Salt water gargling, chloraseptic spray and throat lozenges - OTC guaifenesin (Mucinex) 600 mg twice daily - Saline sinus flushes or a neti pot - Humidifying the air -Tessalon Perles during the day as needed for dry cough

## 2022-07-14 NOTE — ED Provider Notes (Signed)
[facility]    CSN: [id number] Arrival date & time: 07/14/22  0816      History   Chief Complaint Chief Complaint  Patient presents with   Nasal Congestion    HPI Jessica [name] is a 37 y.o. female.   Patient presents today with 5-day history of fever, body aches and chills, congested and dry cough, chest pain that is worse with taking a deep breath, chest tightness, runny and stuffy nose, sinus pressure, headache, bilateral ear pain without drainage, diarrhea, decreased appetite, and fatigue.  She denies sore throat, abdominal pain, nausea/vomiting, or new rash.  Has been taking Sudafed, Mucinex, and ibuprofen without much improvement in symptoms.  Patient reports she typically smokes about 1 pack/day and has not been smoking as much since she has been sick.  Reports she had exercise induced asthma as a child, has not needed an inhaler as an adult.  Reports she was treated approximately 3 months ago for an ear infection with amoxicillin which did fully improved.  Patient has an IUD and does not have regular menstrual cycles.    Past Medical History:  Diagnosis Date   Chronic hip pain    Closed left arm fracture    age [age]   MS (multiple sclerosis) [facility])    Palpitations    Scoliosis     Patient Active Problem List   Diagnosis Date Noted   OSA (obstructive sleep apnea) 10/25/2019   High risk medication use 10/25/2019   Nonintractable episodic headache 10/25/2019   BV (bacterial vaginosis) 12/23/2017   Encounter for IUD insertion 12/23/2017   Depression 09/08/2017   Encounter for smoking cessation counseling 09/08/2017   Multiple sclerosis ([facility]) 05/04/2017   Facial numbness 05/04/2017   Transverse myelitis ([facility]) 05/04/2017   Ataxia 05/04/2017   Urinary urgency 05/04/2017   Neurosensory deficit 05/09/2013   Leg weakness, bilateral 05/09/2013   OBESITY, MORBID 10/22/2009   ULNAR NEUROPATHY 10/22/2009   CHOLELITHIASIS 10/22/2009    Past Surgical  History:  Procedure Laterality Date   adnoids     CHOLECYSTECTOMY     NASAL SEPTUM SURGERY     toe nails removed     TONSILLECTOMY      OB History     Gravida  2   Para  1   Term  1   Preterm      AB  1   Living  1      SAB  1   IAB      Ectopic      Multiple      Live Births  1            Home Medications    Prior to Admission medications   Medication Sig Start Date End Date Taking? Authorizing Provider  amoxicillin-clavulanate (AUGMENTIN) 875-125 MG tablet Take 1 tablet by mouth 2 (two) times daily for 7 days. 07/14/22 07/21/22 Yes Valentino Nose, NP  benzonatate (TESSALON) 100 MG capsule Take 1 capsule (100 mg total) by mouth every 8 (eight) hours. Do not take with alcohol or while driving or operating heavy machinery.  May cause drowsiness. 07/14/22  Yes Valentino Nose, NP  buPROPion (WELLBUTRIN XL) 300 MG 24 hr tablet Take 1 tablet (300 mg total) by mouth daily. Patient not taking: Reported on 04/17/2022 04/23/20   Shawnie Dapper, NP  cyclobenzaprine (FLEXERIL) 10 MG tablet Take 1 tablet (10 mg total) by mouth 2 (two) times daily as needed for muscle spasms. Patient not taking:  Reported on 10/01/2020 07/17/20   Moshe Cipro, NP  DULoxetine (CYMBALTA) 60 MG capsule Take 1 capsule (60 mg total) by mouth daily. Patient not taking: Reported on 04/17/2022 04/23/20   Shawnie Dapper, NP  gabapentin (NEURONTIN) 300 MG capsule Take 1 capsule (300 mg total) by mouth 3 (three) times daily. Patient not taking: Reported on 04/17/2022 04/23/20   Shawnie Dapper, NP  ibuprofen (ADVIL) 600 MG tablet Take 1 tablet (600 mg total) by mouth 3 (three) times daily. 03/26/21   Burgess Amor, PA-C  levonorgestrel (MIRENA) 20 MCG/24HR IUD 1 each by Intrauterine route once.    [provider]  methocarbamol (ROBAXIN) 500 MG tablet Take 2 tablets (1,000 mg total) by mouth up to 3 times a day Patient not taking: Reported on 04/17/2022 11/07/20     solifenacin (VESICARE) 10 MG tablet  Take 1 tablet (10 mg total) by mouth daily. Patient not taking: Reported on 04/17/2022 04/23/20   Shawnie Dapper, NP  VITAMIN D PO Take 1 capsule by mouth daily.  Patient not taking: Reported on 04/17/2022    [provider]  VUMERITY 231 MG CPDR TAKE 2 CAPSULES (462MG ) BY MOUTH TWICE A DAY Patient not taking: Reported on 04/17/2022 09/23/20   Asa Lente, MD    Family History Family History  Problem Relation Age of Onset   Hypertension Mother    Diabetes Mother        prediabetic   COPD Mother    Diabetes Paternal Grandfather    Hypertension Maternal Grandmother    Other Maternal Grandmother    Addison's disease Maternal Grandmother    Cancer Maternal Grandfather     Social History Social History   Tobacco Use   Smoking status: Every Day    Packs/day: 1.00    Years: 13.00    Additional pack years: 0.00    Total pack years: 13.00    Types: Cigarettes   Smokeless tobacco: Never  Vaping Use   Vaping Use: Never used  Substance Use Topics   Alcohol use: Yes   Drug use: Yes    Types: Marijuana    Comment: occ     Allergies   Aleve [naproxen]   Review of Systems Review of Systems Per HPI  Physical Exam Triage Vital Signs ED Triage Vitals [07/14/22 0850]  Enc Vitals Group     BP 118/78     Pulse Rate 76     Resp 20     Temp (!) 97.4 F (36.3 C)     Temp Source Oral     SpO2 96 %     Weight      Height      Head Circumference      Peak Flow      Pain Score 6     Pain Loc      Pain Edu?      Excl. in [place]?    No data found.  Updated Vital Signs BP 118/78 (BP Location: Right Arm)   Pulse 76   Temp (!) 97.4 F (36.3 C) (Oral)   Resp 20   SpO2 96%   Visual Acuity Right Eye Distance:   Left Eye Distance:   Bilateral Distance:    Right Eye Near:   Left Eye Near:    Bilateral Near:     Physical Exam Vitals and nursing note reviewed.  Constitutional:      General: She is not in acute distress.    Appearance: Normal appearance. She is  not ill-appearing or toxic-appearing.  HENT:     Head: Normocephalic and atraumatic.     Right Ear: Ear canal and external ear normal. Tympanic membrane is erythematous.     Left Ear: Ear canal and external ear normal. Tympanic membrane is erythematous and bulging.     Nose: Congestion present. No rhinorrhea.     Mouth/Throat:     Mouth: Mucous membranes are moist.     Pharynx: Oropharynx is clear. No oropharyngeal exudate or posterior oropharyngeal erythema.  Eyes:     General: No scleral icterus.    Extraocular Movements: Extraocular movements intact.  Cardiovascular:     Rate and Rhythm: Normal rate and regular rhythm.  Pulmonary:     Effort: Pulmonary effort is normal. No respiratory distress.     Breath sounds: Normal breath sounds. No wheezing, rhonchi or rales.  Abdominal:     Palpations: Abdomen is soft.  Musculoskeletal:     Cervical back: Normal range of motion and neck supple.  Lymphadenopathy:     Cervical: Cervical adenopathy present.  Skin:    General: Skin is warm and dry.     Coloration: Skin is not jaundiced or pale.     Findings: No erythema or rash.  Neurological:     Mental Status: She is alert and oriented to person, place, and time.  Psychiatric:        Behavior: Behavior is cooperative.      UC Treatments / Results  Labs (all labs ordered are listed, but only abnormal results are displayed) Labs Reviewed - No data to display  EKG   Radiology No results found.  Procedures Procedures (including critical care time)  Medications Ordered in UC Medications - No data to display  Initial Impression / Assessment and Plan / UC Course  I have reviewed the triage vital signs and the nursing notes.  Pertinent labs & imaging results that were available during my care of the patient were reviewed by me and considered in my medical decision making (see chart for details).   Patient is well-appearing, normotensive, afebrile, not tachycardic, not  tachypneic, oxygenating well on room air.    1. Non-recurrent acute suppurative otitis media of left ear without spontaneous rupture of tympanic membrane 2. Acute upper respiratory infection Treat with Augmentin twice daily for 7 days Supportive care discussed Start cough suppressant medication Note given for work Strict ER and return precautions discussed with patient  The patient was given the opportunity to ask questions.  All questions answered to their satisfaction.  The patient is in agreement to this plan.    Final Clinical Impressions(s) / UC Diagnoses   Final diagnoses:  Non-recurrent acute suppurative otitis media of left ear without spontaneous rupture of tympanic membrane  Acute upper respiratory infection     Discharge Instructions      You have an ear infection in your left ear.  Please take the Augmentin as prescribed to treat it.  Symptoms should improve over the next week to 10 days.   Some things that can make you feel better are: - Increased rest - Increasing fluid with water/sugar free electrolytes - Acetaminophen and ibuprofen as needed for fever/pain - Salt water gargling, chloraseptic spray and throat lozenges - OTC guaifenesin (Mucinex) 600 mg twice daily - Saline sinus flushes or a neti pot - Humidifying the air -Tessalon Perles during the day as needed for dry cough     ED Prescriptions     Medication Sig Dispense Auth. Provider  amoxicillin-clavulanate (AUGMENTIN) 875-125 MG tablet Take 1 tablet by mouth 2 (two) times daily for 7 days. 14 tablet Cathlean Marseilles A, NP   benzonatate (TESSALON) 100 MG capsule Take 1 capsule (100 mg total) by mouth every 8 (eight) hours. Do not take with alcohol or while driving or operating heavy machinery.  May cause drowsiness. 21 capsule Valentino Nose, NP      PDMP not reviewed this encounter.   Valentino Nose, NP 07/14/22 [phone]

## 2022-07-22 ENCOUNTER — Ambulatory Visit
Admission: EM | Admit: 2022-07-22 | Discharge: 2022-07-22 | Disposition: A | Discharge status: Home / Self Care | Payer: Self-pay | Attending: Nurse Practitioner | Admitting: Nurse Practitioner

## 2022-07-22 ENCOUNTER — Encounter: Payer: Self-pay | Admitting: Emergency Medicine

## 2022-07-22 DIAGNOSIS — H9201 Otalgia, right ear: Secondary | ICD-10-CM

## 2022-07-22 DIAGNOSIS — H6091 Unspecified otitis externa, right ear: Secondary | ICD-10-CM

## 2022-07-22 MED ORDER — OFLOXACIN 0.3 % OT SOLN: 5.0000 [drp] | Freq: Every day | OTIC | 0 refills | Status: AC

## 2022-07-22 NOTE — ED Triage Notes (Signed)
Right ear pain x 3 days

## 2022-07-22 NOTE — ED Provider Notes (Signed)
[facility]    CSN: [id number] Arrival date & time: 07/22/22  1453      History   Chief Complaint No chief complaint on file.   HPI Jessica [name] is a 37 y.o. female.   HPI  She is in today complaining of right sided ear pain.  She was seen on 07/14/2022 for nonrecurrent acute otitis media without spontaneous rupture of the tympanic membrane and acute respiratory infection.  She was treated with Augmentin 875/125 mg twice daily for 7 days and benzonatate 100 mg every 8 hours for cough.  She reports that she completed the Augmentin several days ago.  She did reports that she completed on Saturday.  She denies any other symptoms, fever, chills, headache, dizziness, shortness of breath, chest pains, nausea or vomiting.  She denies any accident or injury to her right ear.  She denies any use of any foreign objects to clean her ears.  Past Medical History:  Diagnosis Date   Chronic hip pain    Closed left arm fracture    age [age]   MS (multiple sclerosis) [facility])    Palpitations    Scoliosis     Patient Active Problem List   Diagnosis Date Noted   OSA (obstructive sleep apnea) 10/25/2019   High risk medication use 10/25/2019   Nonintractable episodic headache 10/25/2019   BV (bacterial vaginosis) 12/23/2017   Encounter for IUD insertion 12/23/2017   Depression 09/08/2017   Encounter for smoking cessation counseling 09/08/2017   Multiple sclerosis ([facility]) 05/04/2017   Facial numbness 05/04/2017   Transverse myelitis ([facility]) 05/04/2017   Ataxia 05/04/2017   Urinary urgency 05/04/2017   Neurosensory deficit 05/09/2013   Leg weakness, bilateral 05/09/2013   OBESITY, MORBID 10/22/2009   ULNAR NEUROPATHY 10/22/2009   CHOLELITHIASIS 10/22/2009    Past Surgical History:  Procedure Laterality Date   adnoids     CHOLECYSTECTOMY     NASAL SEPTUM SURGERY     toe nails removed     TONSILLECTOMY      OB History     Gravida  2   Para  1   Term  1   Preterm       AB  1   Living  1      SAB  1   IAB      Ectopic      Multiple      Live Births  1            Home Medications    Prior to Admission medications   Medication Sig Start Date End Date Taking? Authorizing Provider  ofloxacin (FLOXIN) 0.3 % OTIC solution Place 5 drops into the right ear daily for 7 days. 07/22/22 07/29/22 Yes Barbette Merino, NP  ibuprofen (ADVIL) 600 MG tablet Take 1 tablet (600 mg total) by mouth 3 (three) times daily. 03/26/21   Burgess Amor, PA-C  levonorgestrel (MIRENA) 20 MCG/24HR IUD 1 each by Intrauterine route once.    [provider]    Family History Family History  Problem Relation Age of Onset   Hypertension Mother    Diabetes Mother        prediabetic   COPD Mother    Diabetes Paternal Grandfather    Hypertension Maternal Grandmother    Other Maternal Grandmother    Addison's disease Maternal Grandmother    Cancer Maternal Grandfather     Social History Social History   Tobacco Use   Smoking status: Every Day  Packs/day: 1.00    Years: 13.00    Additional pack years: 0.00    Total pack years: 13.00    Types: Cigarettes   Smokeless tobacco: Never  Vaping Use   Vaping Use: Never used  Substance Use Topics   Alcohol use: Yes   Drug use: Yes    Types: Marijuana    Comment: occ     Allergies   Aleve [naproxen]   Review of Systems Review of Systems   Physical Exam Triage Vital Signs ED Triage Vitals  Enc Vitals Group     BP 07/22/22 1457 123/79     Pulse Rate 07/22/22 1457 85     Resp 07/22/22 1457 18     Temp 07/22/22 1457 98 F (36.7 C)     Temp Source 07/22/22 1457 Oral     SpO2 07/22/22 1457 96 %     Weight --      Height --      Head Circumference --      Peak Flow --      Pain Score 07/22/22 1458 7     Pain Loc --      Pain Edu? --      Excl. in [place]? --    No data found.  Updated Vital Signs BP 123/79 (BP Location: Right Arm)   Pulse 85   Temp 98 F (36.7 C) (Oral)   Resp 18    SpO2 96%   Visual Acuity Right Eye Distance:   Left Eye Distance:   Bilateral Distance:    Right Eye Near:   Left Eye Near:    Bilateral Near:     Physical Exam Constitutional:      Appearance: She is obese.  HENT:     Head: Normocephalic.     Ears:     Comments: Small amount of erythema to right ear canal Cardiovascular:     Rate and Rhythm: Normal rate.  Pulmonary:     Effort: Pulmonary effort is normal.  Musculoskeletal:        General: Normal range of motion.  Skin:    General: Skin is warm.  Neurological:     Mental Status: She is alert and oriented to person, place, and time.      UC Treatments / Results  Labs (all labs ordered are listed, but only abnormal results are displayed) Labs Reviewed - No data to display  EKG   Radiology No results found.  Procedures Procedures (including critical care time)  Medications Ordered in UC Medications - No data to display  Initial Impression / Assessment and Plan / UC Course  I have reviewed the triage vital signs and the nursing notes.  Pertinent labs & imaging results that were available during my care of the patient were reviewed by me and considered in my medical decision making (see chart for details).     Right ear pain Final Clinical Impressions(s) / UC Diagnoses   Final diagnoses:  Right ear pain  Otitis externa of right ear, unspecified chronicity, unspecified type     Discharge Instructions      You have been given referral to ENT(otolaryngology) for evaluation of recurrent ear related illnesses given your history of sinus surgery. You have been prescribed an eardrop ofloxacin 0.3 % 5 drops to right ear daily, this is effective for treating otitis externa.  This will help relieve your symptoms until you are able to get in with the ENT.  The recommendation is for you to  use Tylenol or ibuprofen as directed do not exceed the recommended daily dose.     ED Prescriptions     Medication Sig  Dispense Auth. Provider   ofloxacin (FLOXIN) 0.3 % OTIC solution Place 5 drops into the right ear daily for 7 days. 5 mL Barbette Merino, NP      PDMP not reviewed this encounter.   Thad Ranger [place], [place] 07/22/22 [phone]

## 2022-07-22 NOTE — Discharge Instructions (Addendum)
You have been given referral to ENT(otolaryngology) for evaluation of recurrent ear related illnesses given your history of sinus surgery. You have been prescribed an eardrop ofloxacin 0.3 % 5 drops to right ear daily, this is effective for treating otitis externa.  This will help relieve your symptoms until you are able to get in with the ENT.  The recommendation is for you to use Tylenol or ibuprofen as directed do not exceed the recommended daily dose.

## 2022-08-01 DIAGNOSIS — Z419 Encounter for procedure for purposes other than remedying health state, unspecified: Secondary | ICD-10-CM | POA: Diagnosis not present

## 2022-08-31 DIAGNOSIS — Z419 Encounter for procedure for purposes other than remedying health state, unspecified: Secondary | ICD-10-CM | POA: Diagnosis not present

## 2022-10-01 DIAGNOSIS — Z419 Encounter for procedure for purposes other than remedying health state, unspecified: Secondary | ICD-10-CM | POA: Diagnosis not present

## 2022-11-01 DIAGNOSIS — Z419 Encounter for procedure for purposes other than remedying health state, unspecified: Secondary | ICD-10-CM | POA: Diagnosis not present

## 2022-12-01 DIAGNOSIS — Z419 Encounter for procedure for purposes other than remedying health state, unspecified: Secondary | ICD-10-CM | POA: Diagnosis not present

## 2023-01-01 DIAGNOSIS — Z419 Encounter for procedure for purposes other than remedying health state, unspecified: Secondary | ICD-10-CM | POA: Diagnosis not present

## 2023-01-31 DIAGNOSIS — Z419 Encounter for procedure for purposes other than remedying health state, unspecified: Secondary | ICD-10-CM | POA: Diagnosis not present

## 2023-03-03 DIAGNOSIS — Z419 Encounter for procedure for purposes other than remedying health state, unspecified: Secondary | ICD-10-CM | POA: Diagnosis not present

## 2023-04-03 DIAGNOSIS — Z419 Encounter for procedure for purposes other than remedying health state, unspecified: Secondary | ICD-10-CM | POA: Diagnosis not present

## 2023-05-01 DIAGNOSIS — Z419 Encounter for procedure for purposes other than remedying health state, unspecified: Secondary | ICD-10-CM | POA: Diagnosis not present

## 2023-05-31 ENCOUNTER — Other Ambulatory Visit (HOSPITAL_COMMUNITY)
Admission: RE | Admit: 2023-05-31 | Discharge: 2023-05-31 | Disposition: A | Discharge status: Home / Self Care | Source: Ambulatory Visit | Attending: Obstetrics & Gynecology | Admitting: Obstetrics & Gynecology

## 2023-05-31 ENCOUNTER — Encounter: Payer: Self-pay | Admitting: Obstetrics & Gynecology

## 2023-05-31 ENCOUNTER — Ambulatory Visit: Admitting: Obstetrics & Gynecology

## 2023-05-31 VITALS — Ht 68.5 in | Wt 267.0 lb

## 2023-05-31 DIAGNOSIS — G35 Multiple sclerosis: Secondary | ICD-10-CM

## 2023-05-31 DIAGNOSIS — J302 Other seasonal allergic rhinitis: Secondary | ICD-10-CM | POA: Diagnosis not present

## 2023-05-31 DIAGNOSIS — Z01419 Encounter for gynecological examination (general) (routine) without abnormal findings: Secondary | ICD-10-CM

## 2023-05-31 MED ORDER — MOMETASONE FUROATE 50 MCG/ACT NA SUSP: 2.0000 | Freq: Every day | NASAL | 12 refills | Status: AC

## 2023-05-31 MED ORDER — CETIRIZINE HCL 10 MG PO TABS: 10.0000 mg | ORAL_TABLET | Freq: Every day | ORAL | 1 refills | Status: AC

## 2023-05-31 NOTE — Progress Notes (Signed)
 Subjective:     Jessica [name] is a 38 y.o. female here for a routine exam.  No LMP recorded. (Menstrual status: IUD). [id number] Birth Control Method:  Mirena IUD placed 09/12/2018 Menstrual Calendar(currently): amenorrheic on MIrena IUD  Current complaints: none.   Current acute medical issues:  MS   Recent Gynecologic History No LMP recorded. (Menstrual status: IUD). Last Pap: 2018,  normal Last mammogram: na,    Past Medical History:  Diagnosis Date   Chronic hip pain    Closed left arm fracture    age [age]   MS (multiple sclerosis) (HCC)    Palpitations    Scoliosis     Past Surgical History:  Procedure Laterality Date   adnoids     CHOLECYSTECTOMY     NASAL SEPTUM SURGERY     toe nails removed     TONSILLECTOMY      OB History     Gravida  2   Para  1   Term  1   Preterm      AB  1   Living  1      SAB  1   IAB      Ectopic      Multiple      Live Births  1           Social History   Socioeconomic History   Marital status: Married    Spouse name: Not on file   Number of children: Not on file   Years of education: Not on file   Highest education level: Not on file  Occupational History   Not on file  Tobacco Use   Smoking status: Every Day    Current packs/day: 1.00    Average packs/day: 1 pack/day for 13.0 years (13.0 ttl pk-yrs)    Types: Cigarettes   Smokeless tobacco: Never  Vaping Use   Vaping status: Never Used  Substance and Sexual Activity   Alcohol use: Yes   Drug use: Yes    Types: Marijuana    Comment: occ   Sexual activity: Yes    Birth control/protection: I.U.D.  Other Topics Concern   Not on file  Social History Narrative   Not on file   Social Drivers of Health   Financial Resource Strain: High Risk (05/31/2023)   Overall Financial Resource Strain (CARDIA)    Difficulty of Paying Living Expenses: Very hard  Food Insecurity: No Food Insecurity (05/31/2023)   Hunger Vital Sign    Worried About  Running Out of Food in the Last Year: Never true    Ran Out of Food in the Last Year: Never true  Transportation Needs: No Transportation Needs (05/31/2023)   PRAPARE - Administrator, Civil Service (Medical): No    Lack of Transportation (Non-Medical): No  Physical Activity: Insufficiently Active (05/31/2023)   Exercise Vital Sign    Days of Exercise per Week: 1 day    Minutes of Exercise per Session: 30 min  Stress: No Stress Concern Present (05/31/2023)   [employer] of Occupational Health - Occupational Stress Questionnaire    Feeling of Stress : Only a little  Social Connections: Socially Isolated (05/31/2023)   Social Connection and Isolation Panel [NHANES]    Frequency of Communication with Friends and Family: Twice a week    Frequency of Social Gatherings with Friends and Family: Twice a week    Attends Religious Services: Never    Database administrator or Organizations: No  Attends Banker Meetings: Never    Marital Status: Separated    Family History  Problem Relation Age of Onset   Hypertension Mother    Diabetes Mother        prediabetic   COPD Mother    Diabetes Paternal Grandfather    Hypertension Maternal Grandmother    Other Maternal Grandmother    Addison's disease Maternal Grandmother    Cancer Maternal Grandfather      Current Outpatient Medications:    cetirizine (ZYRTEC ALLERGY) 10 MG tablet, Take 1 tablet (10 mg total) by mouth daily., Disp: 30 tablet, Rfl: 1   ibuprofen (ADVIL) 600 MG tablet, Take 1 tablet (600 mg total) by mouth 3 (three) times daily., Disp: 21 tablet, Rfl: 0   levonorgestrel (MIRENA) 20 MCG/24HR IUD, 1 each by Intrauterine route once., Disp: , Rfl:    mometasone (NASONEX) 50 MCG/ACT nasal spray, Place 2 sprays into the nose daily., Disp: 1 each, Rfl: 12 No current facility-administered medications for this visit.  Facility-Administered Medications Ordered in Other Visits:    gadopentetate dimeglumine  (MAGNEVIST) injection 20 mL, 20 mL, Intravenous, Once PRN, Anson Fret, MD  Review of Systems  Review of Systems  Constitutional: Negative for fever, chills, weight loss, malaise/fatigue and diaphoresis.  HENT: Negative for hearing loss, ear pain, nosebleeds, congestion, sore throat, neck pain, tinnitus and ear discharge.   Eyes: Negative for blurred vision, double vision, photophobia, pain, discharge and redness.  Respiratory: Negative for cough, hemoptysis, sputum production, shortness of breath, wheezing and stridor.   Cardiovascular: Negative for chest pain, palpitations, orthopnea, claudication, leg swelling and PND.  Gastrointestinal: negative for abdominal pain. Negative for heartburn, nausea, vomiting, diarrhea, constipation, blood in stool and melena.  Genitourinary: Negative for dysuria, urgency, frequency, hematuria and flank pain.  Musculoskeletal: Negative for myalgias, back pain, joint pain and falls.  Skin: Negative for itching and rash.  Neurological: Negative for dizziness, tingling, tremors, sensory change, speech change, focal weakness, seizures, loss of consciousness, weakness and headaches.  Endo/Heme/Allergies: Negative for environmental allergies and polydipsia. Does not bruise/bleed easily.  Psychiatric/Behavioral: Negative for depression, suicidal ideas, hallucinations, memory loss and substance abuse. The patient is not nervous/anxious and does not have insomnia.        Objective:  Height 5' 8.5" (1.74 m), weight 267 lb (121.1 kg).   Physical Exam  Vitals reviewed. Constitutional: She is oriented to person, place, and time. She appears well-developed and well-nourished.  HENT:  Head: Normocephalic and atraumatic.        Right Ear: External ear normal.  Left Ear: External ear normal.  Nose: Nose normal.  Mouth/Throat: Oropharynx is clear and moist.  Eyes: Conjunctivae and EOM are normal. Pupils are equal, round, and reactive to light. Right eye exhibits no  discharge. Left eye exhibits no discharge. No scleral icterus.  Neck: Normal range of motion. Neck supple. No tracheal deviation present. No thyromegaly present.  Cardiovascular: Normal rate, regular rhythm, normal heart sounds and intact distal pulses.  Exam reveals no gallop and no friction rub.   No murmur heard. Respiratory: Effort normal and breath sounds normal. No respiratory distress. She has no wheezes. She has no rales. She exhibits no tenderness.  GI: Soft. Bowel sounds are normal. She exhibits no distension and no mass. There is no tenderness. There is no rebound and no guarding.  Genitourinary:  Breasts no masses skin changes or nipple changes bilaterally      Vulva is normal without lesions Vagina is pink  moist without discharge Cervix normal in appearance and pap is done Uterus is normal size shape and contour Adnexa is negative with normal sized ovaries   Musculoskeletal: Normal range of motion. She exhibits no edema and no tenderness.  Neurological: She is alert and oriented to person, place, and time. She has normal reflexes. She displays normal reflexes. No cranial nerve deficit. She exhibits normal muscle tone. Coordination normal.  Skin: Skin is warm and dry. No rash noted. No erythema. No pallor.  Psychiatric: She has a normal mood and affect. Her behavior is normal. Judgment and thought content normal.       Medications Ordered at today's visit: Meds ordered this encounter  Medications   cetirizine (ZYRTEC ALLERGY) 10 MG tablet    Sig: Take 1 tablet (10 mg total) by mouth daily.    Dispense:  30 tablet    Refill:  1   mometasone (NASONEX) 50 MCG/ACT nasal spray    Sig: Place 2 sprays into the nose daily.    Dispense:  1 each    Refill:  12    Other orders placed at today's visit: Orders Placed This Encounter  Procedures   Ambulatory referral to Neurology     ASSESSMENT + PLAN:    ICD-10-CM   1. Well woman exam with routine gynecological exam  Z01.419      2. Encounter for gynecological examination with Papanicolaou smear of cervix  Z01.419 Cytology - PAP( [facility])    3. MS (multiple sclerosis) (HCC): has not seen neurologist in 3 years  G35 Ambulatory referral to Neurology    4. Seasonal allergies  J30.2           No follow-ups on file.

## 2023-06-01 ENCOUNTER — Ambulatory Visit: Admitting: Obstetrics & Gynecology

## 2023-06-02 LAB — CYTOLOGY - PAP
Adequacy: ABSENT
Chlamydia: NEGATIVE
Comment: NEGATIVE
Comment: NEGATIVE
Comment: NORMAL
Diagnosis: NEGATIVE
High risk HPV: NEGATIVE
Neisseria Gonorrhea: NEGATIVE

## 2023-06-12 DIAGNOSIS — Z419 Encounter for procedure for purposes other than remedying health state, unspecified: Secondary | ICD-10-CM | POA: Diagnosis not present

## 2023-07-12 DIAGNOSIS — Z419 Encounter for procedure for purposes other than remedying health state, unspecified: Secondary | ICD-10-CM | POA: Diagnosis not present

## 2023-07-20 ENCOUNTER — Ambulatory Visit: Admitting: Obstetrics and Gynecology

## 2023-07-20 ENCOUNTER — Encounter: Payer: Self-pay | Admitting: Obstetrics and Gynecology

## 2023-07-20 VITALS — BP 102/70 | HR 85 | Ht 68.0 in | Wt 267.8 lb

## 2023-07-20 DIAGNOSIS — R1909 Other intra-abdominal and pelvic swelling, mass and lump: Secondary | ICD-10-CM | POA: Diagnosis not present

## 2023-07-20 MED ORDER — SULFAMETHOXAZOLE-TRIMETHOPRIM 800-160 MG PO TABS: 1.0000 | ORAL_TABLET | Freq: Two times a day (BID) | ORAL | 0 refills | Status: AC

## 2023-07-20 NOTE — Progress Notes (Signed)
   GYNECOLOGY PROGRESS NOTE  History:  38 y.o. G2P1011 presents to [facility] for problem visit. Reports she felt a nodule in her left groin one week ago. Has gotten bigger since then. Does not hurt unless underwear are rubbing against it for long time.Reports she had very small amount pus/ blood come out few day ago. Nothing since then. No prior lesion, no hx HS  The following portions of the patient's history were reviewed and updated as appropriate: allergies, current medications, past family history, past medical history, past social history, past surgical history and problem list. Last pap smear on 05/31/23 was normal, neg HRHPV.  Health Maintenance Due  Topic Date Due   COVID-19 Vaccine (1) Never done   HIV Screening  Never done   Hepatitis C Screening  Never done   Pneumococcal Vaccine 57-45 Years old (1 of 2 - PCV) Never done     Review of Systems:  Pertinent items are noted in HPI.   Objective:  Physical Exam Blood pressure 102/70, pulse 85, height 5\' 8"  (1.727 m), weight 267 lb 12.8 oz (121.5 kg). VS reviewed, nursing note reviewed,  Constitutional: well developed, HEENT: normocephalic Pulm/chest wall: normal effort Breast Exam: deferred Abdomen: soft Neuro: alert and oriented  Skin: warm, dry Psych: affect normal Pelvic exam/: normal external vagina, 6cmx2cm mobile nodule left groin, nontender, no erythema or drainage, no other lymph node involvement   Assessment & Plan:  1. Nodule of groin (Primary) Dr Randolm Butte in to evaluate given location and symptoms Will trial abx and reassess in 10 days  - sulfamethoxazole-trimethoprim (BACTRIM DS) 800-160 MG tablet; Take 1 tablet by mouth 2 (two) times daily for 10 days.  Dispense: 20 tablet; Refill: 0   Susi Eric, FNP

## 2023-07-30 ENCOUNTER — Ambulatory Visit: Admitting: Obstetrics and Gynecology

## 2023-07-30 VITALS — BP 107/75 | HR 95 | Ht 68.0 in | Wt 265.4 lb

## 2023-07-30 DIAGNOSIS — R1909 Other intra-abdominal and pelvic swelling, mass and lump: Secondary | ICD-10-CM

## 2023-07-30 NOTE — Progress Notes (Signed)
   GYNECOLOGY PROGRESS NOTE  History:  38 y.o. G2P1011 presents to [facility] office today for follow up. Is still taking abx, issed a few doses. Reports the nodule is decreasing in size. She denies any pain or tenderness. No drainage.   Health Maintenance Due  Topic Date Due   COVID-19 Vaccine (1) Never done   HIV Screening  Never done   Hepatitis C Screening  Never done   Pneumococcal Vaccine 49-87 Years old (1 of 2 - PCV) Never done     Review of Systems:  Pertinent items are noted in HPI.   Objective:  Physical Exam Blood pressure 107/75, pulse 95, height 5\' 8"  (1.727 m), weight 265 lb 6.4 oz (120.4 kg). VS reviewed, nursing note reviewed,  Constitutional: well developed, well nourished, no distress Pulm/chest wall: normal effort Abdomen: soft Neuro: alert and oriented  Skin: warm, dry Psych: affect normal Pelvic exam: normal external vagina, mobile nodule 3cmx2cm left groin, nontender, no erythema or drainage, no other lymph node involvement   Assessment & Plan:  1. Nodule of groin (Primary) Is still finishing abx, decreasing in size, will follow up next week to reassess      Susi Eric, FNP

## 2023-08-05 ENCOUNTER — Ambulatory Visit (INDEPENDENT_AMBULATORY_CARE_PROVIDER_SITE_OTHER): Admitting: Obstetrics and Gynecology

## 2023-08-05 ENCOUNTER — Ambulatory Visit: Admitting: Obstetrics and Gynecology

## 2023-08-05 ENCOUNTER — Encounter: Payer: Self-pay | Admitting: Obstetrics and Gynecology

## 2023-08-05 VITALS — BP 104/71 | HR 80 | Ht 68.0 in | Wt 272.0 lb

## 2023-08-05 DIAGNOSIS — R1909 Other intra-abdominal and pelvic swelling, mass and lump: Secondary | ICD-10-CM

## 2023-08-05 MED ORDER — FLUCONAZOLE 150 MG PO TABS: 150.0000 mg | ORAL_TABLET | Freq: Once | ORAL | 0 refills | Status: AC

## 2023-08-05 NOTE — Progress Notes (Signed)
   GYNECOLOGY PROGRESS NOTE  History:  38 y.o. G2P1011 presents to [facility] office today for problem gyn visit. No pain or tenderness,   The following portions of the patient's history were reviewed and updated as appropriate:  Health Maintenance Due  Topic Date Due   COVID-19 Vaccine (1) Never done   HIV Screening  Never done   Hepatitis C Screening  Never done   Pneumococcal Vaccine 36-22 Years old (1 of 2 - PCV) Never done     Review of Systems:  Pertinent items are noted in HPI.   Objective:  Physical Exam Blood pressure 104/71, pulse 80, height 5\' 8"  (1.727 m), weight 272 lb (123.4 kg). VS reviewed, nursing note reviewed,  Constitutional: well developed, well nourished, no distress Pulm/chest wall: normal effort Abdomen: soft Neuro: alert and oriented  Skin: warm, dry Psych: affect normal Pelvic exam: mobile nodule 2x1 cm left groin, nontender, no erythema or drainage, no other lymph node involvement   Assessment & Plan:  1. Nodule of groin (Primary) Shrinking appropriately, follow up if symptoms worsen -rx diflucan sent for yeast after abx Susi Eric, FNP

## 2023-08-12 DIAGNOSIS — Z419 Encounter for procedure for purposes other than remedying health state, unspecified: Secondary | ICD-10-CM | POA: Diagnosis not present

## 2023-08-22 NOTE — Progress Notes (Unsigned)
 [facility]  PATIENT: Jessica [name] DOB: [birth year]  REFERRING DOCTOR OR PCP:  Dr. Ines.   PCP is Social research officer, government SOURCE: Patient, notes from Dr. Ines, imaging and lab results, MRI images on PACS personally reviewed.  _________________________________   HISTORICAL  CHIEF COMPLAINT:  No chief complaint on file.   HISTORY OF PRESENT ILLNESS:  Jessica [name] is a 38 y.o. woman with multiple sclerosis.  Update 10/01/2020: She is  back on Vumerity and tolerates it well.  She has missed doses and we discussed importance of compliance.   MRI 04/25/2019 brain was stable compared to 04/05/2018.   She has no exacerbations and denies any new MS symptoms.     Her gait is about the same with some stumbles but no falls.   Balance is mildly off.    She no longer gets Lhermitte sign symptoms.    However she gets some pain in her neck that seems to radiate into the left arm.   She went to urgent care and was prescribed steroid and a muscle relaxant with transient benefit.   She has no weakness.   She does not muscles fatigue easily and she feels overall weak.     She has more urinary urgency and has had rare urge incontinence.   She had some benefit from solifenacin .    She is having fewer headaches  She does not have nausea or photophobia but had visual changes with the one headache.   Excedrin migraine and other NSAID's are not helping.    Laying down helps some but they do not resolve with sleep.       She had a PSG showing mild OSA with AHI=10   She has lost about 44 pounds and feels she is sleeping better.  SABRA However, she regained some weight recently.       She and her husband had Covid 19.      Cervical CT in 2016 showed C4C5 DJD.  MS History: In March 2015, she had an episode of bilateral leg numbness and right arm numbness and MRI was recommended but she signed out against medical device from the emergency room.  She was numb from the waist down and had  a Lhermitte sign.    This lasted 2 months and completely resolved.   She did not follow-up with neurology.   In mid February 2019, she had the onset of left facial dysesthesia followed by numbness including the tongue she also had some trouble swallowing. She had a scalded sensation in the tongue.    She feels she is about the same.   She feels slightly off with walking but balance is not bad.   She is able to climb stairs without problems.    DMTs:   She started Tecfidera  04/2017 and switched to Vumerity late 2020 due to copays.     Imaging: MRI of the brain 10/22/2020 shows:  Multiple T2/FLAIR hyperintense foci in the hemispheres and left middle cerebellar peduncle in a pattern consistent with chronic demyelinating plaque associated with multiple sclerosis. None of the foci appear to be acute. They do not enhance.  Stable 7-8 mm cerebellar ectopia consistent with a Chiari type I malformation.  MRI of the cervical spine 10/22/2020 showed  Small T2 hyperintense focus posteriorly within the spinal cord adjacent to C3-C4 consistent with a chronic demyelinating plaque.  It does not enhance.  Another focus, also consistent with chronic demyelination is also noted in the left middle cerebellar  peduncle.   Stable 7 to 8 mm cerebellar ectopia consistent with a mild Chiari type I malformation.   .  Disc degenerative changes at C4-C5, C5-C6 and C6-C7.  This leads to mild spinal stenosis at C5-C6.  There does not appear to be nerve root compression at these levels.  MRI of the brain dated 04/28/2017 showed multiple T2/flair hyperintense foci in the periventricular, juxtacortical and deep white matter of both hemispheres, with many of the periventricular foci are radially oriented to the ventricles. She also had a focus in the left middle cerebellar peduncle. None of the foci enhanced.    On the thoracic spine images from 3 years ago, there is a possible T2 level cord focus but study is degraded by movement and  interpretation is fairly difficult.      MRI of the brain dated 04/25/2019 showed multiple T2/FLAIR hyperintense foci in the hemispheres in a pattern and configuration consistent with chronic demyelinating plaque associated with multiple sclerosis.  None of the foci enhances or appears to be acute.  Compared to the MRI from 04/05/2018, there are no new lesions.  Lab work showed a negative ANA, TSH, CBC and CMP.      REVIEW OF SYSTEMS: Constitutional: No fevers, chills, sweats, or change in appetite Eyes: No visual changes, double vision, eye pain Ear, nose and throat: No hearing loss, ear pain, nasal congestion, sore throat Cardiovascular: No chest pain, palpitations Respiratory:  No shortness of breath at rest or with exertion.   No wheezes GastrointestinaI: No nausea, vomiting, diarrhea, abdominal pain, fecal incontinence Genitourinary:  No dysuria, urinary retention or frequency.  No nocturia. Musculoskeletal:  No neck pain, back pain Integumentary: No rash, pruritus, skin lesions Neurological: as above Psychiatric: No depression at this time.  No anxiety Endocrine: No palpitations, diaphoresis, change in appetite, change in weigh or increased thirst Hematologic/Lymphatic:  No anemia, purpura, petechiae. Allergic/Immunologic: No itchy/runny eyes, nasal congestion, recent allergic reactions, rashes  ALLERGIES: Allergies  Allergen Reactions   Aleve [Naproxen]     Loss of consciousness/emesis    HOME MEDICATIONS:  Current Outpatient Medications:    cetirizine  (ZYRTEC  ALLERGY) 10 MG tablet, Take 1 tablet (10 mg total) by mouth daily., Disp: 30 tablet, Rfl: 1   ibuprofen  (ADVIL ) 600 MG tablet, Take 1 tablet (600 mg total) by mouth 3 (three) times daily., Disp: 21 tablet, Rfl: 0   levonorgestrel  (MIRENA ) 20 MCG/24HR IUD, 1 each by Intrauterine route once., Disp: , Rfl:    mometasone  (NASONEX ) 50 MCG/ACT nasal spray, Place 2 sprays into the nose daily. (Patient not taking: Reported on  07/20/2023), Disp: 1 each, Rfl: 12 No current facility-administered medications for this visit.  Facility-Administered Medications Ordered in Other Visits:    gadopentetate dimeglumine  (MAGNEVIST ) injection 20 mL, 20 mL, Intravenous, Once PRN, Ines Onetha NOVAK, MD  PAST MEDICAL HISTORY: Past Medical History:  Diagnosis Date   Chronic hip pain    Closed left arm fracture    age [age]   MS (multiple sclerosis) (HCC)    Palpitations    Scoliosis     PAST SURGICAL HISTORY: Past Surgical History:  Procedure Laterality Date   adnoids     CHOLECYSTECTOMY     NASAL SEPTUM SURGERY     toe nails removed     TONSILLECTOMY      FAMILY HISTORY: Family History  Problem Relation Age of Onset   Hypertension Mother    Diabetes Mother        prediabetic   COPD  Mother    Diabetes Paternal Grandfather    Hypertension Maternal Grandmother    Other Maternal Grandmother    Addison's disease Maternal Grandmother    Cancer Maternal Grandfather     SOCIAL HISTORY:  Social History   Socioeconomic History   Marital status: Married    Spouse name: Not on file   Number of children: Not on file   Years of education: Not on file   Highest education level: Not on file  Occupational History   Not on file  Tobacco Use   Smoking status: Every Day    Current packs/day: 1.00    Average packs/day: 1 pack/day for 13.0 years (13.0 ttl pk-yrs)    Types: Cigarettes   Smokeless tobacco: Never  Vaping Use   Vaping status: Never Used  Substance and Sexual Activity   Alcohol use: Yes   Drug use: Yes    Types: Marijuana    Comment: occ   Sexual activity: Yes    Birth control/protection: I.U.D.  Other Topics Concern   Not on file  Social History Narrative   Not on file   Social Drivers of Health   Financial Resource Strain: High Risk (05/31/2023)   Overall Financial Resource Strain (CARDIA)    Difficulty of Paying Living Expenses: Very hard  Food Insecurity: No Food Insecurity (05/31/2023)    Hunger Vital Sign    Worried About Running Out of Food in the Last Year: Never true    Ran Out of Food in the Last Year: Never true  Transportation Needs: No Transportation Needs (05/31/2023)   PRAPARE - Administrator, Civil Service (Medical): No    Lack of Transportation (Non-Medical): No  Physical Activity: Insufficiently Active (05/31/2023)   Exercise Vital Sign    Days of Exercise per Week: 1 day    Minutes of Exercise per Session: 30 min  Stress: No Stress Concern Present (05/31/2023)   [employer] of Occupational Health - Occupational Stress Questionnaire    Feeling of Stress : Only a little  Social Connections: Socially Isolated (05/31/2023)   Social Connection and Isolation Panel    Frequency of Communication with Friends and Family: Twice a week    Frequency of Social Gatherings with Friends and Family: Twice a week    Attends Religious Services: Never    Database administrator or Organizations: No    Attends Banker Meetings: Never    Marital Status: Separated  Intimate Partner Violence: Not At Risk (05/31/2023)   Humiliation, Afraid, Rape, and Kick questionnaire    Fear of Current or Ex-Partner: No    Emotionally Abused: No    Physically Abused: No    Sexually Abused: No     PHYSICAL EXAM  There were no vitals filed for this visit.   There is no height or weight on file to calculate BMI.   General: The patient is well-developed and well-nourished and in no acute distress.   Sclera are anicteric.   Neck with good ROM.  Mild tenderness left occiput.    Neurologic Exam  Mental status: The patient is alert and oriented x 3 at the time of the examination. The patient has apparent normal recent and remote memory, with an apparently normal attention span and concentration ability.   Speech is normal.  Cranial nerves: Extraocular movements are full.   Color vision was symmetric.  Facial strength and sensation is normal.  Trapezius strength  is normal.  No dysarthria is noted.  No obvious hearing deficits are noted.  Motor:  Muscle bulk is normal.   Tone is normal. Strength is  5 / 5 in all 4 extremities. Rapid alternating movements are performed normally  Sensory: Sensory testing is intact to soft touch and vibration sensation in all 4 extremities.  Coordination: Cerebellar testing reveals good finger-nose-finger and heel-to-shin is now normal and symmetric  Gait and station: Station is normal.   She has a normal gait and tandem gait. . Romberg is negative  Reflexes: Deep tendon reflexes are symmetric and normal bilaterally in the arms and increased at the knees with spread, left > right    There is no ankle clonus.    DIAGNOSTIC DATA (LABS, IMAGING, TESTING) - I reviewed patient records, labs, notes, testing and imaging myself where available.  Lab Results  Component Value Date   WBC 10.7 10/01/2020   HGB 13.7 10/01/2020   HCT 42.7 10/01/2020   MCV 94 10/01/2020   PLT 265 10/01/2020      Component Value Date/Time   NA 141 04/23/2020 0943   K 5.0 04/23/2020 0943   CL 104 04/23/2020 0943   CO2 22 04/23/2020 0943   GLUCOSE 80 04/23/2020 0943   GLUCOSE 94 03/21/2014 0628   BUN 14 04/23/2020 0943   CREATININE 0.73 04/23/2020 0943   CALCIUM 9.0 04/23/2020 0943   PROT 6.4 10/01/2020 1605   ALBUMIN 4.1 10/01/2020 1605   AST 12 10/01/2020 1605   ALT 14 10/01/2020 1605   ALKPHOS 49 10/01/2020 1605   BILITOT 0.4 10/01/2020 1605   GFRNONAA 108 04/23/2020 0943   GFRAA 124 04/23/2020 0943    Lab Results  Component Value Date   TSH 1.510 10/25/2019      ASSESSMENT AND PLAN  No diagnosis found.    1.     Continue Vumerity check CBC with differential, CMP   Check brain MRi and cervical spine MRI to determine if any subclinical progression and consider a different DMT if changes. 2.     Continue Wellbutrin  Xl and Cymbalta  for depression.  3.      Continue Vesicare  for urinary dysfunction 4.     She has  mild OSA. Advised to try to lose weight recently gained.   5.     Return in 6 months or sooner if there are new or worsening neurologic symptoms.   Versia Mignogna A. Vear, MD, [facility] 08/22/2023, 6:51 PM Certified in Neurology, Clinical Neurophysiology, Sleep Medicine, Pain Medicine and Neuroimaging  [facility] [address] [place], [place] [address] [phone]

## 2023-08-25 ENCOUNTER — Ambulatory Visit: Admitting: Neurology

## 2023-08-25 ENCOUNTER — Encounter: Payer: Self-pay | Admitting: Neurology

## 2023-08-25 VITALS — BP 140/74 | HR 66 | Wt 262.5 lb

## 2023-08-25 DIAGNOSIS — G4733 Obstructive sleep apnea (adult) (pediatric): Secondary | ICD-10-CM

## 2023-08-25 DIAGNOSIS — G35 Multiple sclerosis: Secondary | ICD-10-CM

## 2023-08-25 DIAGNOSIS — Z79899 Other long term (current) drug therapy: Secondary | ICD-10-CM

## 2023-08-25 DIAGNOSIS — M542 Cervicalgia: Secondary | ICD-10-CM | POA: Diagnosis not present

## 2023-08-25 DIAGNOSIS — Z6839 Body mass index (BMI) 39.0-39.9, adult: Secondary | ICD-10-CM | POA: Diagnosis not present

## 2023-08-25 DIAGNOSIS — R3915 Urgency of urination: Secondary | ICD-10-CM | POA: Diagnosis not present

## 2023-08-25 MED ORDER — DIMETHYL FUMARATE 240 MG PO CPDR: DELAYED_RELEASE_CAPSULE | ORAL | 11 refills | Status: AC

## 2023-08-25 MED ORDER — SOLIFENACIN SUCCINATE 10 MG PO TABS: ORAL_TABLET | ORAL | 11 refills | Status: AC

## 2023-08-25 MED ORDER — MELOXICAM 15 MG PO TABS: 15.0000 mg | ORAL_TABLET | Freq: Every day | ORAL | 11 refills | Status: DC

## 2023-08-25 MED ORDER — PHENTERMINE HCL 37.5 MG PO CAPS: 37.5000 mg | ORAL_CAPSULE | ORAL | 5 refills | Status: DC

## 2023-08-25 MED ORDER — DIMETHYL FUMARATE 120 MG PO CPDR: DELAYED_RELEASE_CAPSULE | ORAL | 0 refills | Status: AC

## 2023-08-25 MED ORDER — CYCLOBENZAPRINE HCL 5 MG PO TABS: 5.0000 mg | ORAL_TABLET | Freq: Three times a day (TID) | ORAL | 5 refills | Status: DC | PRN

## 2023-08-26 ENCOUNTER — Ambulatory Visit: Payer: Self-pay | Admitting: Neurology

## 2023-08-26 LAB — COMPREHENSIVE METABOLIC PANEL WITH GFR
ALT: 13 IU/L (ref 0–32)
AST: 12 IU/L (ref 0–40)
Albumin: 4.2 g/dL (ref 3.9–4.9)
Alkaline Phosphatase: 52 IU/L (ref 44–121)
BUN/Creatinine Ratio: 20 (ref 9–23)
BUN: 16 mg/dL (ref 6–20)
Bilirubin Total: <0.2 mg/dL (ref 0.0–1.2)
CO2: 23 mmol/L (ref 20–29)
Calcium: 9.7 mg/dL (ref 8.7–10.2)
Chloride: 105 mmol/L (ref 96–106)
Creatinine, Ser: 0.79 mg/dL (ref 0.57–1.00)
Globulin, Total: 2.5 g/dL (ref 1.5–4.5)
Glucose: 85 mg/dL (ref 70–99)
Potassium: 5 mmol/L (ref 3.5–5.2)
Sodium: 143 mmol/L (ref 134–144)
Total Protein: 6.7 g/dL (ref 6.0–8.5)
eGFR: 99 mL/min/{1.73_m2} (ref >59)

## 2023-08-26 LAB — CBC WITH DIFFERENTIAL/PLATELET
Basophils Absolute: 0 10*3/uL (ref 0.0–0.2)
Basos: 0 %
EOS (ABSOLUTE): 0.2 10*3/uL (ref 0.0–0.4)
Eos: 2 %
Hematocrit: 43 % (ref 34.0–46.6)
Hemoglobin: 14.2 g/dL (ref 11.1–15.9)
Immature Grans (Abs): 0 10*3/uL (ref 0.0–0.1)
Immature Granulocytes: 0 %
Lymphocytes Absolute: 3.2 10*3/uL — ABNORMAL HIGH (ref 0.7–3.1)
Lymphs: 30 %
MCH: 31.1 pg (ref 26.6–33.0)
MCHC: 33 g/dL (ref 31.5–35.7)
MCV: 94 fL (ref 79–97)
Monocytes Absolute: 0.6 10*3/uL (ref 0.1–0.9)
Monocytes: 6 %
Neutrophils Absolute: 6.4 10*3/uL (ref 1.4–7.0)
Neutrophils: 62 %
Platelets: 284 10*3/uL (ref 150–450)
RBC: 4.57 x10E6/uL (ref 3.77–5.28)
RDW: 12.9 % (ref 11.7–15.4)
WBC: 10.5 10*3/uL (ref 3.4–10.8)

## 2023-09-09 ENCOUNTER — Telehealth: Payer: Self-pay | Admitting: Neurology

## 2023-09-09 NOTE — Telephone Encounter (Signed)
 [facility]: [id number] exp. 08/30/23-10/29/23 sent to GI [phone]

## 2023-09-11 DIAGNOSIS — Z419 Encounter for procedure for purposes other than remedying health state, unspecified: Secondary | ICD-10-CM | POA: Diagnosis not present

## 2023-09-21 ENCOUNTER — Other Ambulatory Visit

## 2023-09-30 ENCOUNTER — Ambulatory Visit
Admission: RE | Admit: 2023-09-30 | Discharge: 2023-09-30 | Disposition: A | Discharge status: Home / Self Care | Source: Ambulatory Visit | Attending: Neurology | Admitting: Neurology

## 2023-09-30 DIAGNOSIS — G35 Multiple sclerosis: Secondary | ICD-10-CM

## 2023-09-30 MED ORDER — GADOPICLENOL 0.5 MMOL/ML IV SOLN
10.0000 mL | Freq: Once | INTRAVENOUS | Status: AC | PRN
  Administered 2023-09-30: 10 mL via INTRAVENOUS

## 2023-10-12 DIAGNOSIS — Z419 Encounter for procedure for purposes other than remedying health state, unspecified: Secondary | ICD-10-CM | POA: Diagnosis not present

## 2023-11-12 DIAGNOSIS — Z419 Encounter for procedure for purposes other than remedying health state, unspecified: Secondary | ICD-10-CM | POA: Diagnosis not present

## 2024-01-02 ENCOUNTER — Encounter: Payer: Self-pay | Admitting: Obstetrics & Gynecology

## 2024-01-05 ENCOUNTER — Ambulatory Visit (INDEPENDENT_AMBULATORY_CARE_PROVIDER_SITE_OTHER)

## 2024-01-05 ENCOUNTER — Other Ambulatory Visit (HOSPITAL_COMMUNITY)
Admission: RE | Admit: 2024-01-05 | Discharge: 2024-01-05 | Disposition: A | Discharge status: Home / Self Care | Source: Ambulatory Visit | Attending: Obstetrics & Gynecology | Admitting: Obstetrics & Gynecology

## 2024-01-05 DIAGNOSIS — N9489 Other specified conditions associated with female genital organs and menstrual cycle: Secondary | ICD-10-CM | POA: Diagnosis present

## 2024-01-05 DIAGNOSIS — R350 Frequency of micturition: Secondary | ICD-10-CM

## 2024-01-05 DIAGNOSIS — R52 Pain, unspecified: Secondary | ICD-10-CM

## 2024-01-05 LAB — POCT URINALYSIS DIPSTICK OB
Blood, UA: NEGATIVE
Glucose, UA: NEGATIVE
Ketones, UA: NEGATIVE
Leukocytes, UA: NEGATIVE
Nitrite, UA: NEGATIVE
POC,PROTEIN,UA: NEGATIVE

## 2024-01-05 NOTE — Progress Notes (Signed)
   NURSE VISIT- UTI SYMPTOMS   SUBJECTIVE:  Jessica [name] is a 38 y.o. G32P1011 female here for UTI symptoms. She is a GYN patient. She reports fever two days ago, urinary frequency, and burning with sitting.  OBJECTIVE:  There were no vitals taken for this visit.  Appears well, in no apparent distress  No results found for this or any previous visit (from the past 24 hours).  ASSESSMENT: GYN patient with UTI symptoms and negative nitrites Swab also obtained for BV yeast. PLAN: Note routed to Jessica [name], Jessica [name]   Rx sent by provider today: No Urine culture sent Call or return to clinic prn if these symptoms worsen or fail to improve as anticipated. Follow-up: as needed   Jessica [name]  01/05/2024 3:16 PM

## 2024-01-06 LAB — URINALYSIS, ROUTINE W REFLEX MICROSCOPIC
Bilirubin, UA: NEGATIVE
Glucose, UA: NEGATIVE
Ketones, UA: NEGATIVE
Leukocytes,UA: NEGATIVE
Nitrite, UA: NEGATIVE
Protein,UA: NEGATIVE
RBC, UA: NEGATIVE
Specific Gravity, UA: 1.025 (ref 1.005–1.030)
Urobilinogen, Ur: 0.2 mg/dL (ref 0.2–1.0)
pH, UA: 6 (ref 5.0–7.5)

## 2024-01-07 ENCOUNTER — Ambulatory Visit: Payer: Self-pay | Admitting: Adult Health

## 2024-01-07 LAB — CERVICOVAGINAL ANCILLARY ONLY
Bacterial Vaginitis (gardnerella): NEGATIVE
Candida Glabrata: NEGATIVE
Candida Vaginitis: POSITIVE — AB
Comment: NEGATIVE
Comment: NEGATIVE
Comment: NEGATIVE

## 2024-01-07 LAB — URINE CULTURE

## 2024-01-07 MED ORDER — FLUCONAZOLE 150 MG PO TABS: ORAL_TABLET | ORAL | 1 refills | Status: AC

## 2024-01-17 ENCOUNTER — Ambulatory Visit
Admission: EM | Admit: 2024-01-17 | Discharge: 2024-01-17 | Disposition: A | Discharge status: Home / Self Care | Attending: Nurse Practitioner | Admitting: Nurse Practitioner

## 2024-01-17 DIAGNOSIS — H11421 Conjunctival edema, right eye: Secondary | ICD-10-CM

## 2024-01-17 MED ORDER — OLOPATADINE HCL 0.2 % OP SOLN: OPHTHALMIC | 0 refills | Status: AC

## 2024-01-17 MED ORDER — DEXAMETHASONE SOD PHOSPHATE PF 10 MG/ML IJ SOLN
10.0000 mg | Freq: Once | INTRAMUSCULAR | Status: AC
  Administered 2024-01-17: 10 mg via INTRAMUSCULAR

## 2024-01-17 NOTE — ED Triage Notes (Signed)
 Pt states that she feels like she has something in her right eye. Pt states that she has some swelling, redness and pain. X1 day

## 2024-01-17 NOTE — ED Provider Notes (Signed)
 [facility]    CSN: [id number] Arrival date & time: 01/17/24  1737      History   Chief Complaint Chief Complaint  Patient presents with   Eye Problem    HPI Jessica [name] is a 38 y.o. female.   The history is provided by the patient.   Patient presents for complaints of swelling around the white of the right eye.  Patient states she was driving and symptoms started suddenly.  She denies injury, trauma, sudden loss of vision, eye drainage, or erythema.  Patient states that she does not wear eyeglasses or contacts.  Patient states, I feel like there is something in my eye.  So far, the patient has not taken any medications for her symptoms.  Past Medical History:  Diagnosis Date   Chronic hip pain    Closed left arm fracture    age 38/14   MS (multiple sclerosis)    Palpitations    Scoliosis     Patient Active Problem List   Diagnosis Date Noted   OSA (obstructive sleep apnea) 10/25/2019   High risk medication use 10/25/2019   Nonintractable episodic headache 10/25/2019   BV (bacterial vaginosis) 12/23/2017   Encounter for IUD insertion 12/23/2017   Depression 09/08/2017   Encounter for smoking cessation counseling 09/08/2017   Multiple sclerosis 05/04/2017   Facial numbness 05/04/2017   Transverse myelitis (HCC) 05/04/2017   Ataxia 05/04/2017   Urinary urgency 05/04/2017   Neurosensory deficit 05/09/2013   Leg weakness, bilateral 05/09/2013   OBESITY, MORBID 10/22/2009   ULNAR NEUROPATHY 10/22/2009   Calculus of gallbladder 10/22/2009    Past Surgical History:  Procedure Laterality Date   adnoids     CHOLECYSTECTOMY     DENTAL SURGERY     NASAL SEPTUM SURGERY     toe nails removed     TONSILLECTOMY      OB History     Gravida  2   Para  1   Term  1   Preterm      AB  1   Living  1      SAB  1   IAB      Ectopic      Multiple      Live Births  1            Home Medications    Prior to Admission  medications   Medication Sig Start Date End Date Taking? Authorizing Provider  cetirizine  (ZYRTEC  ALLERGY) 10 MG tablet Take 1 tablet (10 mg total) by mouth daily. 05/31/23  Yes Jayne Vonn DEL, MD  cyclobenzaprine  (FLEXERIL ) 5 MG tablet Take 1 tablet (5 mg total) by mouth every 8 (eight) hours as needed for muscle spasms. 08/25/23  Yes Sater, Charlie LABOR, MD  Dimethyl Fumarate  (TECFIDERA ) 240 MG CPDR 1 p.o. twice daily after meals 08/25/23  Yes Sater, Charlie LABOR, MD  Dimethyl Fumarate  120 MG CPDR One po bid with food 08/25/23  Yes Sater, Charlie LABOR, MD  fluconazole  (DIFLUCAN ) 150 MG tablet Take 1 now and 1 in 3 days 01/07/24  Yes Signa Nest A, NP  ibuprofen  (ADVIL ) 600 MG tablet Take 1 tablet (600 mg total) by mouth 3 (three) times daily. 03/26/21  Yes Idol, Julie, PA-C  levonorgestrel  (MIRENA ) 20 MCG/24HR IUD 1 each by Intrauterine route once.   Yes [provider]  meloxicam  (MOBIC ) 15 MG tablet Take 1 tablet (15 mg total) by mouth daily. 08/25/23  Yes Sater, Charlie LABOR, MD  mometasone  (NASONEX ) 50 MCG/ACT nasal spray Place 2 sprays into the nose daily. 05/31/23  Yes Jayne Vonn DEL, MD  phentermine  37.5 MG capsule Take 1 capsule (37.5 mg total) by mouth every morning. 08/25/23  Yes Sater, Charlie LABOR, MD  solifenacin  (VESICARE ) 10 MG tablet One po qd 08/25/23  Yes Sater, Charlie LABOR, MD    Family History Family History  Problem Relation Age of Onset   Hypertension Mother    Diabetes Mother        prediabetic   COPD Mother    Diabetes Paternal Grandfather    Hypertension Maternal Grandmother    Other Maternal Grandmother    Addison's disease Maternal Grandmother    Cancer Maternal Grandfather     Social History Social History   Tobacco Use   Smoking status: Every Day    Current packs/day: 1.00    Average packs/day: 1 pack/day for 13.0 years (13.0 ttl pk-yrs)    Types: Cigarettes   Smokeless tobacco: Never  Vaping Use   Vaping status: Never Used  Substance Use Topics   Alcohol  use: Yes   Drug use: Yes    Types: Marijuana    Comment: occ     Allergies   Aleve [naproxen]   Review of Systems Review of Systems Per HPI  Physical Exam Triage Vital Signs ED Triage Vitals  Encounter Vitals Group     BP 01/17/24 1800 121/81     Girls Systolic BP Percentile --      Girls Diastolic BP Percentile --      Boys Systolic BP Percentile --      Boys Diastolic BP Percentile --      Pulse Rate 01/17/24 1800 82     Resp 01/17/24 1800 18     Temp 01/17/24 1800 98.1 F (36.7 C)     Temp Source 01/17/24 1800 Oral     SpO2 01/17/24 1800 98 %     Weight --      Height 01/17/24 1759 5' 8 (1.727 m)     Head Circumference --      Peak Flow --      Pain Score 01/17/24 1758 5     Pain Loc --      Pain Education --      Exclude from Growth Chart --    No data found.  Updated Vital Signs BP 121/81 (BP Location: Right Arm)   Pulse 82   Temp 98.1 F (36.7 C) (Oral)   Resp 18   Ht 5' 8 (1.727 m)   SpO2 98%   BMI 39.91 kg/m   Visual Acuity Right Eye Distance: 20/40 Left Eye Distance: 20/20 Bilateral Distance:    Right Eye Near:   Left Eye Near:    Bilateral Near:     Physical Exam Vitals and nursing note reviewed.  Constitutional:      General: She is not in acute distress.    Appearance: Normal appearance.  HENT:     Head: Normocephalic.  Eyes:     General: Lids are normal. No visual field deficit.       Right eye: No foreign body, discharge or hordeolum.     Extraocular Movements: Extraocular movements intact.     Right eye: Normal extraocular motion and no nystagmus.     Conjunctiva/sclera:     Right eye: Chemosis present.     Pupils: Pupils are equal, round, and reactive to light.  Pulmonary:     Effort: Pulmonary effort is  normal.  Musculoskeletal:     Cervical back: Normal range of motion.  Skin:    General: Skin is warm and dry.  Neurological:     General: No focal deficit present.     Mental Status: She is alert and oriented to  person, place, and time.  Psychiatric:        Mood and Affect: Mood normal.        Behavior: Behavior normal.      UC Treatments / Results  Labs (all labs ordered are listed, but only abnormal results are displayed) Labs Reviewed - No data to display  EKG   Radiology No results found.  Procedures Procedures (including critical care time)  Medications Ordered in UC Medications - No data to display  Initial Impression / Assessment and Plan / UC Course  I have reviewed the triage vital signs and the nursing notes.  Pertinent labs & imaging results that were available during my care of the patient were reviewed by me and considered in my medical decision making (see chart for details).  Patient presents with sudden onset of chemosis of the right eye.  Patient states she was driving when the symptoms started.  She denies any possible allergies, injury, or trauma.  Given the sudden onset of the patient's symptoms and current presentation, call on-call Dr. Medford Gaudy to discuss patient's symptoms.  Dr. Gaudy recommended eyedrops and an antihistamine such as Benadryl .  He also recommended the patient follow-up in his office if symptoms fail to improve over the next 24 hours.  Decadron  10 mg IM administered for possible allergic reaction.  Pataday 0.2% eyedrops prescribed.  Supportive care recommendations were provided and discussed with the patient to include over-the-counter analgesics, warm compresses to the eye, and to monitor for signs of worsening.  Patient was given strict ER follow-up precautions, and advised to follow-up with Dr. Dewitt office if symptoms fail to improve.  Patient was in agreement with this plan of care and verbalizes understanding.  All questions were answered.  Patient stable for discharge.  Final Clinical Impressions(s) / UC Diagnoses   Final diagnoses:  None   Discharge Instructions   None    ED Prescriptions   None    PDMP not reviewed this  encounter.   Gilmer Etta PARAS, NP 01/17/24 1905

## 2024-01-17 NOTE — Discharge Instructions (Addendum)
 You were given an injection of Decadron  10 mg. Use eyedrops as prescribed. Begin taking Benadryl  every 6 hours to help offset any allergic reaction. Apply warm compresses to the eye for pain or discomfort, cool compresses to help with pain or swelling. Do not rub or manipulate the eye while symptoms persist. As discussed, if symptoms suddenly worsen, please go to the emergency department immediately.  If your symptoms do not improve over the next 24 hours, please follow-up with [facility] as discussed. Follow-up as needed.

## 2024-02-11 DIAGNOSIS — Z419 Encounter for procedure for purposes other than remedying health state, unspecified: Secondary | ICD-10-CM | POA: Diagnosis not present

## 2024-03-23 ENCOUNTER — Ambulatory Visit: Admitting: Neurology

## 2024-03-31 ENCOUNTER — Ambulatory Visit: Admitting: Neurology

## 2024-03-31 ENCOUNTER — Encounter: Payer: Self-pay | Admitting: Neurology

## 2024-03-31 VITALS — BP 122/79 | HR 93 | Ht 68.5 in | Wt 266.0 lb

## 2024-03-31 DIAGNOSIS — R3915 Urgency of urination: Secondary | ICD-10-CM

## 2024-03-31 DIAGNOSIS — R269 Unspecified abnormalities of gait and mobility: Secondary | ICD-10-CM

## 2024-03-31 DIAGNOSIS — G4733 Obstructive sleep apnea (adult) (pediatric): Secondary | ICD-10-CM

## 2024-03-31 DIAGNOSIS — M542 Cervicalgia: Secondary | ICD-10-CM | POA: Diagnosis not present

## 2024-03-31 DIAGNOSIS — Z79899 Other long term (current) drug therapy: Secondary | ICD-10-CM | POA: Diagnosis not present

## 2024-03-31 DIAGNOSIS — G35A Relapsing-remitting multiple sclerosis: Secondary | ICD-10-CM

## 2024-03-31 MED ORDER — MELOXICAM 15 MG PO TABS: 15.0000 mg | ORAL_TABLET | Freq: Every day | ORAL | 11 refills | Status: AC

## 2024-03-31 MED ORDER — MODAFINIL 200 MG PO TABS: 200.0000 mg | ORAL_TABLET | Freq: Every day | ORAL | 5 refills | Status: AC

## 2024-03-31 MED ORDER — CYCLOBENZAPRINE HCL 5 MG PO TABS: 5.0000 mg | ORAL_TABLET | Freq: Three times a day (TID) | ORAL | 5 refills | Status: AC | PRN

## 2024-03-31 MED ORDER — PHENTERMINE HCL 37.5 MG PO CAPS: 37.5000 mg | ORAL_CAPSULE | ORAL | 5 refills | Status: AC

## 2024-03-31 NOTE — Progress Notes (Signed)
 "  [facility]  PATIENT: Jessica [name] DOB: 1986-01-20  REFERRING DOCTOR OR PCP:  Dr. Ines.   PCP is Social Research Officer, [employer] SOURCE: Patient, notes from Dr. Ines, imaging and lab results, MRI images on PACS personally reviewed.  _________________________________   HISTORICAL  CHIEF COMPLAINT:  Chief Complaint  Patient presents with   Follow-up    Rm 11, alone.   Taking her dimethyl fumarate  only as needed due to SE (flushing and nausea).  Has alot fatigue.      HISTORY OF PRESENT ILLNESS:  Jessica [name] is a 39 y.o. woman with multiple sclerosis.  Update 08/25/2023 (last seen 03/31/2024: She is on DMF but has persistent nausea and flushing and would like to consider different disease modifying therapy..    She has not experienced recent exacerbations.  MRI of the brain July 2025 showed no new lesions.  MRI of the cervical spine shows a focus adjacent to C4.  Her gait is doing well.   She can go up and down stairs but knee pain affects her more than balance. She no longer gets Lhermitte sign symptoms.  She continues to sometimes experience dysesthesias.  She has more urinary urgency and has had rare urge incontinence.   She had some benefit from solifenacin .  (Takes mot days).  Vision is doing well.  She has some migraines - 3 to 5 a month - but these are less intense than HA a couple years ago.  No recent severe migraine.    Laying down helps some but they do not resolve with sleep.       She notes a lot of fatigue.   She was recently fired at her last job due to fatigue on the job that affected performance.   Phentermine  has not helped the fatigue much.  She feels energy is good in the morning but she start dragging every afternoon.     Sleep is variable.  She has both sleep maintenance and sleep onset issues but not usually the same night.    A bigger problem is insomnia.   She had a PSG showing mild OSA with AHI=10   She lost about 44 pounds but  regained some weight recently.        EPWORTH SLEEPINESS SCALE  On a scale of 0 - 3 what is the chance of dozing:  Sitting and Reading:   0 Watching TV: (or audiobook)  3 Sitting inactive in a public place: 1 Passenger in car for one hour: 0 Lying down to rest in the afternoon: 1 Sitting and talking to someone: 0 Sitting quietly after lunch:  0 In a car, stopped in traffic:  0  Total (out of 24):   5/24     Cervical CT in 2016 showed C4C5 DJD.  MS History: In March 2015, she had an episode of bilateral leg numbness and right arm numbness and MRI was recommended but she signed out against medical device from the emergency room.  She was numb from the waist down and had a Lhermitte sign.    This lasted 2 months and completely resolved.   She did not follow-up with neurology.   In mid February 2019, she had the onset of left facial dysesthesia followed by numbness including the tongue she also had some trouble swallowing. She had a scalded sensation in the tongue.    She feels she is about the same.   She feels slightly off with walking but balance is not bad.  She is able to climb stairs without problems.    DMTs:   She started Tecfidera  04/2017 and switched to Vumerity late 2020 due to copays.   Dimethyl fumarate  was poorly tolerated.  Kesimpta was prescribed February 2026  Imaging: MRI brain 09/30/2023 showed Multiple T2/FLAIR hyperintense foci in the cerebral hemispheres and foci in the left superior cerebellar peduncle and spinal cord in a pattern consistent with chronic demyelinating plaque associated with multiple sclerosis. None of the foci enhancement appear to be acute. Compared to the MRI from 10/22/2020, there were no new lesions.   MRI cervical spine 09/30/2023 showed  Small T2 hyperintense focus posteriorly adjacent to C4, unchanged compared to the MRI from 10/22/2020.  It does not enhance.    At C4-C5, there are degenerative changes causing mild spinal stenosis and bilateral  foraminal narrowing but no nerve root compression..  At C5-C6, there are degenerative changes causing mild spinal stenosis and moderate left greater than right foraminal narrowing though there does not appear to be nerve root compression..  At C6-C7, there are degenerative changes causing mild to moderate foraminal narrowing but no spinal stenosis or nerve root compression.  MRI of the brain 10/22/2020 shows:  Multiple T2/FLAIR hyperintense foci in the hemispheres and left middle cerebellar peduncle in a pattern consistent with chronic demyelinating plaque associated with multiple sclerosis. None of the foci appear to be acute. They do not enhance.  Stable 7-8 mm cerebellar ectopia consistent with a Chiari type I malformation.  MRI of the cervical spine 10/22/2020 showed  Small T2 hyperintense focus posteriorly within the spinal cord adjacent to C3-C4 consistent with a chronic demyelinating plaque.  It does not enhance.  Another focus, also consistent with chronic demyelination is also noted in the left middle cerebellar peduncle.   Stable 7 to 8 mm cerebellar ectopia consistent with a mild Chiari type I malformation.   .  Disc degenerative changes at C4-C5, C5-C6 and C6-C7.  This leads to mild spinal stenosis at C5-C6.  There does not appear to be nerve root compression at these levels.  MRI of the brain dated 04/28/2017 showed multiple T2/flair hyperintense foci in the periventricular, juxtacortical and deep white matter of both hemispheres, with many of the periventricular foci are radially oriented to the ventricles. She also had a focus in the left middle cerebellar peduncle. None of the foci enhanced.    On the thoracic spine images from 3 years ago, there is a possible T2 level cord focus but study is degraded by movement and interpretation is fairly difficult.      MRI of the brain dated 04/25/2019 showed multiple T2/FLAIR hyperintense foci in the hemispheres in a pattern and configuration consistent  with chronic demyelinating plaque associated with multiple sclerosis.  None of the foci enhances or appears to be acute.  Compared to the MRI from 04/05/2018, there are no new lesions.  Lab work showed a negative ANA, TSH, CBC and CMP.      REVIEW OF SYSTEMS: Constitutional: No fevers, chills, sweats, or change in appetite Eyes: No visual changes, double vision, eye pain Ear, nose and throat: No hearing loss, ear pain, nasal congestion, sore throat Cardiovascular: No chest pain, palpitations Respiratory:  No shortness of breath at rest or with exertion.   No wheezes GastrointestinaI: No nausea, vomiting, diarrhea, abdominal pain, fecal incontinence Genitourinary:  No dysuria, urinary retention or frequency.  No nocturia. Musculoskeletal:  No neck pain, back pain Integumentary: No rash, pruritus, skin lesions Neurological: as above Psychiatric:  No depression at this time.  No anxiety Endocrine: No palpitations, diaphoresis, change in appetite, change in weigh or increased thirst Hematologic/Lymphatic:  No anemia, purpura, petechiae. Allergic/Immunologic: No itchy/runny eyes, nasal congestion, recent allergic reactions, rashes  ALLERGIES: Allergies  Allergen Reactions   Aleve [Naproxen]     Loss of consciousness/emesis    HOME MEDICATIONS:  Current Outpatient Medications:    cetirizine  (ZYRTEC  ALLERGY) 10 MG tablet, Take 1 tablet (10 mg total) by mouth daily., Disp: 30 tablet, Rfl: 1   Dimethyl Fumarate  (TECFIDERA ) 240 MG CPDR, 1 p.o. twice daily after meals, Disp: 60 capsule, Rfl: 11   ibuprofen  (ADVIL ) 600 MG tablet, Take 1 tablet (600 mg total) by mouth 3 (three) times daily., Disp: 21 tablet, Rfl: 0   levonorgestrel  (MIRENA ) 20 MCG/24HR IUD, 1 each by Intrauterine route once., Disp: , Rfl:    modafinil  (PROVIGIL ) 200 MG tablet, Take 1 tablet (200 mg total) by mouth daily., Disp: 30 tablet, Rfl: 5   mometasone  (NASONEX ) 50 MCG/ACT nasal spray, Place 2 sprays into the nose  daily., Disp: 1 each, Rfl: 12   solifenacin  (VESICARE ) 10 MG tablet, One po qd, Disp: 30 tablet, Rfl: 11   cyclobenzaprine  (FLEXERIL ) 5 MG tablet, Take 1 tablet (5 mg total) by mouth every 8 (eight) hours as needed for muscle spasms., Disp: 30 tablet, Rfl: 5   Dimethyl Fumarate  120 MG CPDR, One po bid with food (Patient not taking: Reported on 03/31/2024), Disp: 14 capsule, Rfl: 0   fluconazole  (DIFLUCAN ) 150 MG tablet, Take 1 now and 1 in 3 days (Patient not taking: Reported on 03/31/2024), Disp: 2 tablet, Rfl: 1   meloxicam  (MOBIC ) 15 MG tablet, Take 1 tablet (15 mg total) by mouth daily., Disp: 30 tablet, Rfl: 11   Olopatadine  HCl 0.2 % SOLN, Apply 1 drop in the right eye twice daily until symptoms improve. (Patient not taking: Reported on 03/31/2024), Disp: 7 mL, Rfl: 0   phentermine  37.5 MG capsule, Take 1 capsule (37.5 mg total) by mouth every morning., Disp: 30 capsule, Rfl: 5 No current facility-administered medications for this visit.  Facility-Administered Medications Ordered in Other Visits:    gadopentetate dimeglumine  (MAGNEVIST ) injection 20 mL, 20 mL, Intravenous, Once PRN, Ines Onetha NOVAK, MD  PAST MEDICAL HISTORY: Past Medical History:  Diagnosis Date   Chronic hip pain    Closed left arm fracture    age [age]   MS (multiple sclerosis)    Palpitations    Scoliosis     PAST SURGICAL HISTORY: Past Surgical History:  Procedure Laterality Date   adnoids     CHOLECYSTECTOMY     DENTAL SURGERY     NASAL SEPTUM SURGERY     toe nails removed     TONSILLECTOMY      FAMILY HISTORY: Family History  Problem Relation Age of Onset   Hypertension Mother    Diabetes Mother        prediabetic   COPD Mother    Diabetes Paternal Grandfather    Hypertension Maternal Grandmother    Other Maternal Grandmother    Addison's disease Maternal Grandmother    Cancer Maternal Grandfather     SOCIAL HISTORY:  Social History   Socioeconomic History   Marital status: Married     Spouse name: Not on file   Number of children: Not on file   Years of education: Not on file   Highest education level: Not on file  Occupational History   Not on file  Tobacco  Use   Smoking status: Every Day    Current packs/day: 1.00    Average packs/day: 1 pack/day for 13.0 years (13.0 ttl pk-yrs)    Types: Cigarettes   Smokeless tobacco: Never  Vaping Use   Vaping status: Never Used  Substance and Sexual Activity   Alcohol use: Yes   Drug use: Yes    Types: Marijuana    Comment: occ   Sexual activity: Yes    Birth control/protection: I.U.D.  Other Topics Concern   Not on file  Social History Narrative   Not on file   Social Drivers of Health   Tobacco Use: High Risk (03/31/2024)   Patient History    Smoking Tobacco Use: Every Day    Smokeless Tobacco Use: Never    Passive Exposure: Not on file  Financial Resource Strain: High Risk (05/31/2023)   Overall Financial Resource Strain (CARDIA)    Difficulty of Paying Living Expenses: Very hard  Food Insecurity: No Food Insecurity (05/31/2023)   Hunger Vital Sign    Worried About Running Out of Food in the Last Year: Never true    Ran Out of Food in the Last Year: Never true  Transportation Needs: No Transportation Needs (05/31/2023)   PRAPARE - Administrator, Civil Service (Medical): No    Lack of Transportation (Non-Medical): No  Physical Activity: Insufficiently Active (05/31/2023)   Exercise Vital Sign    Days of Exercise per Week: 1 day    Minutes of Exercise per Session: 30 min  Stress: No Stress Concern Present (05/31/2023)   [employer] of Occupational Health - Occupational Stress Questionnaire    Feeling of Stress : Only a little  Social Connections: Socially Isolated (05/31/2023)   Social Connection and Isolation Panel    Frequency of Communication with Friends and Family: Twice a week    Frequency of Social Gatherings with Friends and Family: Twice a week    Attends Religious Services: Never     Database Administrator or Organizations: No    Attends Banker Meetings: Never    Marital Status: Separated  Intimate Partner Violence: Not At Risk (05/31/2023)   Humiliation, Afraid, Rape, and Kick questionnaire    Fear of Current or Ex-Partner: No    Emotionally Abused: No    Physically Abused: No    Sexually Abused: No  Depression (PHQ2-9): Medium Risk (05/31/2023)   Depression (PHQ2-9)    PHQ-2 Score: 10  Alcohol Screen: Low Risk (05/31/2023)   Alcohol Screen    Last Alcohol Screening Score (AUDIT): 3  Housing: Low Risk (05/31/2023)   Housing Stability Vital Sign    Unable to Pay for Housing in the Last Year: No    Number of Times Moved in the Last Year: 0    Homeless in the Last Year: No  Utilities: Not At Risk (05/31/2023)   AHC Utilities    Threatened with loss of utilities: No  Health Literacy: Not on file     PHYSICAL EXAM  Vitals:   03/31/24 1105  BP: 122/79  Pulse: 93  Weight: 266 lb (120.7 kg)  Height: 5' 8.5 (1.74 m)      Body mass index is 39.86 kg/m.   General: The patient is well-developed and well-nourished and in no acute distress.   Sclera are anicteric.   Neck with good ROM.  Tenderness left occiput.    Neurologic Exam  Mental status: The patient is alert and oriented x 3 at the time  of the examination. The patient has apparent normal recent and remote memory, with an apparently normal attention span and concentration ability.   Speech is normal.  Cranial nerves: Extraocular movements are full.   Color vision was symmetric.  Facial strength and sensation is normal.  Trapezius strength is normal.  No dysarthria is noted.    No obvious hearing deficits are noted.  Motor:  Muscle bulk is normal.  Muscle tone is normal.  Strength is  5 / 5 in all 4 extremities. Rapid alternating movements are performed normally  Sensory: Sensory testing is intact to soft touch and vibration sensation in arms, mild reduced touch and vibration in right  leg vs left.   Coordination: Cerebellar testing reveals good finger-nose-finger and heel-to-shin is now normal and symmetric  Gait and station: Station is normal.   The gait was fairly normal now.  Tandem gait is wide Romberg is negative  Reflexes: Deep tendon reflexes are symmetric and normal bilaterally in the arms and increased at the knees with spread, left > right    There is no ankle clonus.    DIAGNOSTIC DATA (LABS, IMAGING, TESTING) - I reviewed patient records, labs, notes, testing and imaging myself where available.  Lab Results  Component Value Date   WBC 10.5 08/25/2023   HGB 14.2 08/25/2023   HCT 43.0 08/25/2023   MCV 94 08/25/2023   PLT 284 08/25/2023      Component Value Date/Time   NA 143 08/25/2023 1533   K 5.0 08/25/2023 1533   CL 105 08/25/2023 1533   CO2 23 08/25/2023 1533   GLUCOSE 85 08/25/2023 1533   GLUCOSE 94 03/21/2014 0628   BUN 16 08/25/2023 1533   CREATININE 0.79 08/25/2023 1533   CALCIUM 9.7 08/25/2023 1533   PROT 6.7 08/25/2023 1533   ALBUMIN 4.2 08/25/2023 1533   AST 12 08/25/2023 1533   ALT 13 08/25/2023 1533   ALKPHOS 52 08/25/2023 1533   BILITOT <0.2 08/25/2023 1533   GFRNONAA 108 04/23/2020 0943   GFRAA 124 04/23/2020 0943    Lab Results  Component Value Date   TSH 1.510 10/25/2019      ASSESSMENT AND PLAN  1. Multiple sclerosis, relapsing-remitting   2. High risk medication use   3. Urinary urgency   4. OSA (obstructive sleep apnea)   5. Gait disturbance   6. Neck pain      1.     She has been unable to tolerate Tecfidera  and also had tolerability issues with Vumerity.  2.     Meloxicam  and prn cyclobenzaprine  for neck pain 3.     Continue Vesicare  for urinary dysfunction 4.     She has mild OSA. Advised to try to lose weight recently gained.   5.     Phentermine  to help weight loss and MS/OSA related sleepiness and fatigue.  Addition of modafinil  to see if fatigue is better managed 6.  Return in 6 months or sooner  if there are new or worsening neurologic symptoms.      Shaneen Reeser A. Vear, MD, Teola RENO 03/31/2024, 1:23 PM Certified in Neurology, Clinical Neurophysiology, Sleep Medicine, Pain Medicine and Neuroimaging  [facility] [address] [place], [place] [address] [phone] "

## 2024-04-04 ENCOUNTER — Ambulatory Visit: Payer: Self-pay | Admitting: Neurology

## 2024-04-04 LAB — HEPATITIS B SURFACE ANTIGEN: Hepatitis B Surface Ag: NEGATIVE

## 2024-04-04 LAB — CBC WITH DIFFERENTIAL/PLATELET
Basophils Absolute: 0 10*3/uL (ref 0.0–0.2)
Basos: 0 %
EOS (ABSOLUTE): 0.1 10*3/uL (ref 0.0–0.4)
Eos: 2 %
Hematocrit: 42.6 % (ref 34.0–46.6)
Hemoglobin: 14.4 g/dL (ref 11.1–15.9)
Immature Grans (Abs): 0 10*3/uL (ref 0.0–0.1)
Immature Granulocytes: 0 %
Lymphocytes Absolute: 2.5 10*3/uL (ref 0.7–3.1)
Lymphs: 30 %
MCH: 30.6 pg (ref 26.6–33.0)
MCHC: 33.8 g/dL (ref 31.5–35.7)
MCV: 90 fL (ref 79–97)
Monocytes Absolute: 0.4 10*3/uL (ref 0.1–0.9)
Monocytes: 4 %
Neutrophils Absolute: 5.5 10*3/uL (ref 1.4–7.0)
Neutrophils: 64 %
Platelets: 323 10*3/uL (ref 150–450)
RBC: 4.71 x10E6/uL (ref 3.77–5.28)
RDW: 12.1 % (ref 11.7–15.4)
WBC: 8.5 10*3/uL (ref 3.4–10.8)

## 2024-04-04 LAB — QUANTIFERON-TB GOLD PLUS
QuantiFERON Mitogen Value: >10 [IU]/mL
QuantiFERON Nil Value: 0.04 [IU]/mL
QuantiFERON TB1 Ag Value: 0.04 [IU]/mL
QuantiFERON TB2 Ag Value: 0.04 [IU]/mL

## 2024-04-04 LAB — HEPATITIS B CORE ANTIBODY, TOTAL: Hep B Core Total Ab: NEGATIVE

## 2024-04-04 LAB — IGG, IGA, IGM
IgG (Immunoglobin G), Serum: 1136 mg/dL (ref 586–1602)
IgM (Immunoglobulin M), Srm: 83 mg/dL (ref 26–217)
Immunoglobulin A, (IgA) QN, Serum: 189 mg/dL (ref 87–352)

## 2024-04-04 LAB — HEPATIC FUNCTION PANEL
ALT: 16 [IU]/L (ref 0–32)
AST: 16 [IU]/L (ref 0–40)
Albumin: 4.1 g/dL (ref 3.9–4.9)
Alkaline Phosphatase: 62 [IU]/L (ref 41–116)
Bilirubin Total: 0.2 mg/dL (ref 0.0–1.2)
Bilirubin, Direct: <0.08 mg/dL (ref 0.00–0.40)
Total Protein: 6.6 g/dL (ref 6.0–8.5)

## 2024-04-04 LAB — VARICELLA ZOSTER ANTIBODY, IGG: Varicella zoster IgG: NONREACTIVE

## 2024-04-04 LAB — HIV ANTIBODY (ROUTINE TESTING W REFLEX): HIV Screen 4th Generation wRfx: NONREACTIVE

## 2024-04-06 NOTE — Telephone Encounter (Signed)
 Kesimpta start form faxed to [phone]:  Also contacted pt as she requested but she didn't answer and I couldn't leave msg

## 2024-04-07 NOTE — Progress Notes (Signed)
 PA for Kesimpta completed via CMM and sent to [facility]. Should have a determination within 3-5 business days. Key: [id number].

## 2024-08-30 ENCOUNTER — Ambulatory Visit: Admitting: Neurology
